# Patient Record
Sex: Male | Born: 1945 | Race: White | Hispanic: No | Smoking: Never smoker
Health system: Southern US, Community
[De-identification: ages and names within clinical notes are randomized; demographics above are authoritative.]

## PROBLEM LIST (undated history)

## (undated) DIAGNOSIS — I10 Essential (primary) hypertension: Secondary | ICD-10-CM

## (undated) DIAGNOSIS — N200 Calculus of kidney: Secondary | ICD-10-CM

## (undated) DIAGNOSIS — N189 Chronic kidney disease, unspecified: Secondary | ICD-10-CM

## (undated) HISTORY — PX: CHOLECYSTECTOMY: SHX55

## (undated) HISTORY — PX: HEMORRHOID SURGERY: SHX153

## (undated) HISTORY — PX: BACK SURGERY: SHX140

---

## 2000-07-10 ENCOUNTER — Encounter: Payer: Self-pay | Admitting: Neurosurgery

## 2000-07-10 ENCOUNTER — Ambulatory Visit (HOSPITAL_COMMUNITY): Admission: RE | Admit: 2000-07-10 | Discharge: 2000-07-10 | Payer: Self-pay | Admitting: Neurosurgery

## 2010-09-27 ENCOUNTER — Encounter: Admission: RE | Admit: 2010-09-27 | Discharge: 2010-09-27 | Payer: Self-pay | Admitting: Neurosurgery

## 2012-06-24 ENCOUNTER — Emergency Department (HOSPITAL_BASED_OUTPATIENT_CLINIC_OR_DEPARTMENT_OTHER): Payer: BC Managed Care – PPO

## 2012-06-24 ENCOUNTER — Emergency Department (HOSPITAL_BASED_OUTPATIENT_CLINIC_OR_DEPARTMENT_OTHER)
Admission: EM | Admit: 2012-06-24 | Discharge: 2012-06-25 | Disposition: A | Discharge status: Home / Self Care | Payer: BC Managed Care – PPO | Attending: Emergency Medicine | Admitting: Emergency Medicine

## 2012-06-24 ENCOUNTER — Encounter (HOSPITAL_BASED_OUTPATIENT_CLINIC_OR_DEPARTMENT_OTHER): Payer: Self-pay | Admitting: *Deleted

## 2012-06-24 DIAGNOSIS — N200 Calculus of kidney: Secondary | ICD-10-CM | POA: Insufficient documentation

## 2012-06-24 DIAGNOSIS — N289 Disorder of kidney and ureter, unspecified: Secondary | ICD-10-CM | POA: Insufficient documentation

## 2012-06-24 DIAGNOSIS — R109 Unspecified abdominal pain: Secondary | ICD-10-CM | POA: Insufficient documentation

## 2012-06-24 DIAGNOSIS — R1110 Vomiting, unspecified: Secondary | ICD-10-CM | POA: Insufficient documentation

## 2012-06-24 HISTORY — DX: Calculus of kidney: N20.0

## 2012-06-24 LAB — BASIC METABOLIC PANEL
BUN: 29 mg/dL — ABNORMAL HIGH (ref 6–23)
GFR calc Af Amer: 51 mL/min — ABNORMAL LOW (ref >90)
GFR calc non Af Amer: 44 mL/min — ABNORMAL LOW (ref >90)
Potassium: 4.7 mEq/L (ref 3.5–5.1)
Sodium: 141 mEq/L (ref 135–145)

## 2012-06-24 LAB — URINALYSIS, ROUTINE W REFLEX MICROSCOPIC
Nitrite: NEGATIVE
Specific Gravity, Urine: 1.031 — ABNORMAL HIGH (ref 1.005–1.030)
Urobilinogen, UA: 0.2 mg/dL (ref 0.0–1.0)

## 2012-06-24 LAB — URINE MICROSCOPIC-ADD ON

## 2012-06-24 MED ORDER — HYDROMORPHONE HCL PF 1 MG/ML IJ SOLN
1.0000 mg | Freq: Once | INTRAMUSCULAR | Status: AC
  Administered 2012-06-24: 1 mg via INTRAVENOUS
  Filled 2012-06-24: qty 1

## 2012-06-24 MED ORDER — KETOROLAC TROMETHAMINE 30 MG/ML IJ SOLN
INTRAMUSCULAR | Status: AC
  Filled 2012-06-24: qty 1

## 2012-06-24 MED ORDER — SODIUM CHLORIDE 0.9 % IV BOLUS (SEPSIS)
500.0000 mL | Freq: Once | INTRAVENOUS | Status: AC
  Administered 2012-06-24: 500 mL via INTRAVENOUS

## 2012-06-24 MED ORDER — HYDROMORPHONE HCL PF 1 MG/ML IJ SOLN: 1.0000 mg | Freq: Once | INTRAMUSCULAR | Status: DC

## 2012-06-24 MED ORDER — HYDROMORPHONE HCL PF 1 MG/ML IJ SOLN
INTRAMUSCULAR | Status: AC
  Filled 2012-06-24: qty 1

## 2012-06-24 MED ORDER — ONDANSETRON HCL 4 MG/2ML IJ SOLN
4.0000 mg | Freq: Once | INTRAMUSCULAR | Status: AC
  Administered 2012-06-24: 4 mg via INTRAVENOUS
  Filled 2012-06-24: qty 2

## 2012-06-24 NOTE — ED Notes (Signed)
Pt has hx of kidney stone in March and states this feels the same.

## 2012-06-24 NOTE — ED Provider Notes (Signed)
History     CSN: [id number]  Arrival date & time 06/24/12  1517   First MD Initiated Contact with Patient 06/24/12 1534      Chief Complaint  Patient presents with  . Flank Pain    (Consider location/radiation/quality/duration/timing/severity/associated sxs/prior treatment) HPI Comments: Pt states he had kidney stone and he is having similar symptoms  Patient is a 66 y.o. male presenting with flank pain. The history is provided by the patient. No language interpreter was used.  Flank Pain This is a recurrent problem. The current episode started today. The problem occurs constantly. The problem has been unchanged. Associated symptoms include abdominal pain and vomiting. Pertinent negatives include no fever. Nothing aggravates the symptoms. Treatments tried: percocet. The treatment provided moderate relief.    Past Medical History  Diagnosis Date  . Kidney stone     Past Surgical History  Procedure Date  . Cholecystectomy   . Back surgery   . Hemorrhoid surgery     History reviewed. No pertinent family history.  History  Substance Use Topics  . Smoking status: Never Smoker   . Smokeless tobacco: Not on file  . Alcohol Use: No      Review of Systems  Constitutional: Negative for fever.  Respiratory: Negative.   Cardiovascular: Negative.   Gastrointestinal: Positive for vomiting and abdominal pain.  Genitourinary: Positive for flank pain.    Allergies  Review of patient's allergies indicates no known allergies.  Home Medications   Current Outpatient Rx  Name Route Sig Dispense Refill  . VITAMIN D 1000 UNITS PO TABS Oral Take 2,000 Units by mouth daily.    [name] Kitchen NAPROXEN SODIUM 220 MG PO TABS Oral Take 440 mg by mouth 2 (two) times daily with a meal.    . OMEPRAZOLE 20 MG PO CPDR Oral Take 20 mg by mouth daily.    . OXYCODONE-ACETAMINOPHEN 5-325 MG PO TABS Oral Take 1-2 tablets by mouth every 4 (four) hours as needed. For pain    . TETRAHYDROZOLINE-ZN SULFATE  0.05-0.25 % OP SOLN Both Eyes Place 2 drops into both eyes 2 (two) times daily as needed. For dry, itchy eyes    . ZOLPIDEM TARTRATE 10 MG PO TABS Oral Take 10 mg by mouth at bedtime as needed. For sleep      BP 170/83  Pulse 58  Temp 97.4 F (36.3 C) (Oral)  Resp 20  Ht 6\' 1"  (1.854 m)  Wt 225 lb (102.059 kg)  BMI 29.69 kg/m2  SpO2 98%  Physical Exam  Nursing note and vitals reviewed. Constitutional: He is oriented to person, place, and time. He appears well-developed and well-nourished.  HENT:  Head: Normocephalic and atraumatic.  Eyes: Conjunctivae and EOM are normal.  Neck: Neck supple.  Cardiovascular: Normal rate and regular rhythm.   Pulmonary/Chest: Effort normal and breath sounds normal.  Abdominal: Soft. Bowel sounds are normal. There is no tenderness.  Musculoskeletal: Normal range of motion.  Neurological: He is alert and oriented to person, place, and time.  Skin: Skin is warm and dry.  Psychiatric: He has a normal mood and affect.    ED Course  Procedures (including critical care time)  Labs Reviewed  URINALYSIS, ROUTINE W REFLEX MICROSCOPIC - Abnormal; Notable for the following:    Color, Urine AMBER (*)  BIOCHEMICALS MAY BE AFFECTED BY COLOR   Specific Gravity, Urine 1.031 (*)     Hgb urine dipstick LARGE (*)     Bilirubin Urine SMALL (*)  Ketones, ur 15 (*)     All other components within normal limits  BASIC METABOLIC PANEL - Abnormal; Notable for the following:    Glucose, Bld 117 (*)     BUN 29 (*)     Creatinine, Ser 1.60 (*)     GFR calc non Af Amer 44 (*)     GFR calc Af Amer 51 (*)     All other components within normal limits  URINE MICROSCOPIC-ADD ON - Abnormal; Notable for the following:    Bacteria, UA FEW (*)     Casts HYALINE CASTS (*)     Crystals CA OXALATE CRYSTALS (*)     All other components within normal limits   No results found.   1. Renal insufficiency   2. Kidney stone       MDM  No acute findings noted on  x-ray:pt comfortable at discharge:pt sent home with scripts and follow up with his urologist on paper charting        Teressa Lower, NP 06/27/12 1248  Teressa Lower, NP 06/27/12 1541

## 2012-06-28 NOTE — ED Provider Notes (Signed)
Medical screening examination/treatment/procedure(s) were performed by non-physician practitioner and as supervising physician I was immediately available for consultation/collaboration.   Charles B. Bernette Mayers, MD 06/28/12 [phone]

## 2014-06-10 DIAGNOSIS — H9201 Otalgia, right ear: Secondary | ICD-10-CM | POA: Insufficient documentation

## 2015-12-09 DIAGNOSIS — R7989 Other specified abnormal findings of blood chemistry: Secondary | ICD-10-CM | POA: Insufficient documentation

## 2015-12-09 HISTORY — DX: Other specified abnormal findings of blood chemistry: R79.89

## 2016-03-03 DIAGNOSIS — M2011 Hallux valgus (acquired), right foot: Secondary | ICD-10-CM | POA: Insufficient documentation

## 2016-03-03 DIAGNOSIS — L84 Corns and callosities: Secondary | ICD-10-CM | POA: Insufficient documentation

## 2016-11-08 DIAGNOSIS — Z96651 Presence of right artificial knee joint: Secondary | ICD-10-CM | POA: Insufficient documentation

## 2016-12-28 DIAGNOSIS — N4341 Spermatocele of epididymis, single: Secondary | ICD-10-CM | POA: Insufficient documentation

## 2016-12-28 DIAGNOSIS — N5203 Combined arterial insufficiency and corporo-venous occlusive erectile dysfunction: Secondary | ICD-10-CM | POA: Insufficient documentation

## 2017-04-18 DIAGNOSIS — M51369 Other intervertebral disc degeneration, lumbar region without mention of lumbar back pain or lower extremity pain: Secondary | ICD-10-CM

## 2017-04-18 DIAGNOSIS — M5136 Other intervertebral disc degeneration, lumbar region: Secondary | ICD-10-CM

## 2017-04-18 DIAGNOSIS — Z981 Arthrodesis status: Secondary | ICD-10-CM | POA: Insufficient documentation

## 2017-04-18 HISTORY — DX: Other intervertebral disc degeneration, lumbar region without mention of lumbar back pain or lower extremity pain: M51.369

## 2018-01-06 DIAGNOSIS — N1831 Chronic kidney disease, stage 3a: Secondary | ICD-10-CM | POA: Diagnosis present

## 2018-01-06 DIAGNOSIS — N183 Chronic kidney disease, stage 3 unspecified: Secondary | ICD-10-CM

## 2018-01-06 HISTORY — DX: Chronic kidney disease, stage 3 unspecified: N18.30

## 2018-09-10 DIAGNOSIS — N200 Calculus of kidney: Secondary | ICD-10-CM | POA: Insufficient documentation

## 2018-10-02 DIAGNOSIS — R972 Elevated prostate specific antigen [PSA]: Secondary | ICD-10-CM | POA: Insufficient documentation

## 2019-09-25 DIAGNOSIS — Z9182 Personal history of military deployment: Secondary | ICD-10-CM | POA: Insufficient documentation

## 2019-09-25 DIAGNOSIS — E782 Mixed hyperlipidemia: Secondary | ICD-10-CM

## 2019-09-25 DIAGNOSIS — F4312 Post-traumatic stress disorder, chronic: Secondary | ICD-10-CM | POA: Insufficient documentation

## 2019-09-25 DIAGNOSIS — F5221 Male erectile disorder: Secondary | ICD-10-CM | POA: Insufficient documentation

## 2019-09-25 DIAGNOSIS — K219 Gastro-esophageal reflux disease without esophagitis: Secondary | ICD-10-CM

## 2019-09-25 DIAGNOSIS — G4700 Insomnia, unspecified: Secondary | ICD-10-CM

## 2019-09-25 HISTORY — DX: Insomnia, unspecified: G47.00

## 2019-09-25 HISTORY — DX: Gastro-esophageal reflux disease without esophagitis: K21.9

## 2019-09-25 HISTORY — DX: Mixed hyperlipidemia: E78.2

## 2019-12-27 ENCOUNTER — Ambulatory Visit: Payer: Medicare Other | Attending: Internal Medicine

## 2019-12-27 DIAGNOSIS — Z23 Encounter for immunization: Secondary | ICD-10-CM

## 2019-12-27 NOTE — Progress Notes (Signed)
   Covid-19 Vaccination Clinic  Name:  Brandon Burns    MRN: [id number] DOB: 1946/01/30  12/27/2019  [name] was observed post Covid-19 immunization for 15 minutes without incidence. He was provided with Vaccine Information Sheet and instruction to access the V-Safe system.   [name] was instructed to call 911 with any severe reactions post vaccine: [name] Kitchen Difficulty breathing  . Swelling of your face and throat  . A fast heartbeat  . A bad rash all over your body  . Dizziness and weakness    Immunizations Administered    Name Date Dose VIS Date Route   Pfizer COVID-19 Vaccine 12/27/2019  6:12 PM 0.3 mL 11/15/2019 Intramuscular   Manufacturer: [employer], [employer]   Lot: [id number]   NDC: [address]-8

## 2020-01-14 ENCOUNTER — Ambulatory Visit: Payer: Medicare Other | Attending: Internal Medicine

## 2020-01-14 DIAGNOSIS — Z23 Encounter for immunization: Secondary | ICD-10-CM

## 2020-01-14 NOTE — Progress Notes (Signed)
   Covid-19 Vaccination Clinic  Name:  NYQUAN SELBE    MRN: [id number] DOB: [birth year]  01/14/2020  Mr. Mcpartlin was observed post Covid-19 immunization for 15 minutes without incidence. He was provided with Vaccine Information Sheet and instruction to access the V-Safe system.   Mr. Lenis was instructed to call 911 with any severe reactions post vaccine: [name] Kitchen Difficulty breathing  . Swelling of your face and throat  . A fast heartbeat  . A bad rash all over your body  . Dizziness and weakness    Immunizations Administered    Name Date Dose VIS Date Route   Pfizer COVID-19 Vaccine 01/14/2020 12:47 PM 0.3 mL 11/15/2019 Intramuscular   Manufacturer: [employer], [employer]   Lot: [id number]   NDC: [address]-7

## 2020-06-14 ENCOUNTER — Encounter (HOSPITAL_BASED_OUTPATIENT_CLINIC_OR_DEPARTMENT_OTHER): Payer: Self-pay | Admitting: Emergency Medicine

## 2020-06-14 ENCOUNTER — Emergency Department (HOSPITAL_BASED_OUTPATIENT_CLINIC_OR_DEPARTMENT_OTHER)
Admission: EM | Admit: 2020-06-14 | Discharge: 2020-06-14 | Disposition: A | Discharge status: Home / Self Care | Payer: Medicare Other | Attending: Emergency Medicine | Admitting: Emergency Medicine

## 2020-06-14 ENCOUNTER — Other Ambulatory Visit: Payer: Self-pay

## 2020-06-14 DIAGNOSIS — N3000 Acute cystitis without hematuria: Secondary | ICD-10-CM

## 2020-06-14 DIAGNOSIS — R300 Dysuria: Secondary | ICD-10-CM | POA: Diagnosis present

## 2020-06-14 LAB — URINALYSIS, ROUTINE W REFLEX MICROSCOPIC
Bilirubin Urine: NEGATIVE
Glucose, UA: NEGATIVE mg/dL
Ketones, ur: NEGATIVE mg/dL
Nitrite: NEGATIVE
Protein, ur: NEGATIVE mg/dL
Specific Gravity, Urine: 1.015 (ref 1.005–1.030)
pH: 5.5 (ref 5.0–8.0)

## 2020-06-14 LAB — URINALYSIS, MICROSCOPIC (REFLEX): Squamous Epithelial / LPF: NONE SEEN (ref 0–5)

## 2020-06-14 MED ORDER — CEPHALEXIN 500 MG PO CAPS: 500.0000 mg | ORAL_CAPSULE | Freq: Three times a day (TID) | ORAL | 0 refills | Status: DC

## 2020-06-14 MED ORDER — CEPHALEXIN 250 MG PO CAPS
500.0000 mg | ORAL_CAPSULE | Freq: Once | ORAL | Status: AC
  Administered 2020-06-14: 500 mg via ORAL
  Filled 2020-06-14: qty 2

## 2020-06-14 NOTE — ED Triage Notes (Signed)
Pt reports frequent urination and dysuria, flank pain onset today. Denies fever.

## 2020-06-14 NOTE — ED Provider Notes (Signed)
[facility] EMERGENCY DEPARTMENT Provider Note   CSN: [id number] Arrival date & time: 06/14/20  1930     History Chief Complaint  Patient presents with  . Dysuria    Brandon Burns is a 74 y.o. male.  HPI     This is a 74 year old male with no significant past medical history who presents with dysuria and frequency.  Patient reports 1 day history of urgency and frequency.  History of UTI in the past with similar symptoms.  Denies any systemic symptoms including fever, abdominal pain, nausea, vomiting, back pain.  Reports that he feels somewhat better but has persistent frequency.  No history of kidney stones.  Past Medical History:  Diagnosis Date  . Kidney stone     There are no problems to display for this patient.   Past Surgical History:  Procedure Laterality Date  . BACK SURGERY    . CHOLECYSTECTOMY    . HEMORRHOID SURGERY         No family history on file.  Social History   Tobacco Use  . Smoking status: Never Smoker  . Smokeless tobacco: Never Used  Substance Use Topics  . Alcohol use: No  . Drug use: No    Home Medications Prior to Admission medications   Medication Sig Start Date End Date Taking? Authorizing Provider  cephALEXin (KEFLEX) 500 MG capsule Take 1 capsule (500 mg total) by mouth 3 (three) times daily. 06/14/20   Nihaal Friesen, Mayer Masker, MD  cholecalciferol (VITAMIN D) 1000 UNITS tablet Take 2,000 Units by mouth daily.    [provider]  naproxen sodium (ANAPROX) 220 MG tablet Take 440 mg by mouth 2 (two) times daily with a meal.    [provider]  omeprazole (PRILOSEC) 20 MG capsule Take 20 mg by mouth daily.    [provider]  oxyCODONE-acetaminophen (PERCOCET/ROXICET) 5-325 MG per tablet Take 1-2 tablets by mouth every 4 (four) hours as needed. For pain    [provider]  tetrahydrozoline-zinc (VISINE-AC) 0.05-0.25 % ophthalmic solution Place 2 drops into both eyes 2 (two) times daily as  needed. For dry, itchy eyes    [provider]  zolpidem (AMBIEN) 10 MG tablet Take 10 mg by mouth at bedtime as needed. For sleep    [provider]    Allergies    Patient has no known allergies.  Review of Systems   Review of Systems  Constitutional: Negative for fever.  Respiratory: Negative for shortness of breath.   Cardiovascular: Negative for chest pain.  Gastrointestinal: Negative for abdominal pain, nausea and vomiting.  Genitourinary: Positive for dysuria and frequency. Negative for flank pain.  All other systems reviewed and are negative.   Physical Exam Updated Vital Signs BP 128/85 (BP Location: Right Arm)   Pulse 66   Temp 98.7 F (37.1 C) (Oral)   Resp 16   Ht 1.854 m (6\' 1" )   SpO2 99%   BMI 29.69 kg/m   Physical Exam Vitals and nursing note reviewed.  Constitutional:      Appearance: He is well-developed. He is not ill-appearing.  HENT:     Head: Normocephalic and atraumatic.     Mouth/Throat:     Mouth: Mucous membranes are moist.  Eyes:     Pupils: Pupils are equal, round, and reactive to light.  Cardiovascular:     Rate and Rhythm: Normal rate and regular rhythm.     Heart sounds: Normal heart sounds. No murmur heard.  Pulmonary:     Effort: Pulmonary effort is normal. No respiratory distress.     Breath sounds: Normal breath sounds. No wheezing.  Abdominal:     General: Bowel sounds are normal.     Palpations: Abdomen is soft.     Tenderness: There is no abdominal tenderness. There is no right CVA tenderness, left CVA tenderness or rebound.  Musculoskeletal:     Cervical back: Neck supple.  Lymphadenopathy:     Cervical: No cervical adenopathy.  Skin:    General: Skin is warm and dry.  Neurological:     Mental Status: He is alert and oriented to person, place, and time.  Psychiatric:        Mood and Affect: Mood normal.     ED Results / Procedures / Treatments   Labs (all labs ordered are listed, but only  abnormal results are displayed) Labs Reviewed  URINALYSIS, ROUTINE W REFLEX MICROSCOPIC - Abnormal; Notable for the following components:      Result Value   APPearance CLOUDY (*)    Hgb urine dipstick SMALL (*)    Leukocytes,Ua MODERATE (*)    All other components within normal limits  URINALYSIS, MICROSCOPIC (REFLEX) - Abnormal; Notable for the following components:   Bacteria, UA FEW (*)    All other components within normal limits    EKG None  Radiology No results found.  Procedures Procedures (including critical care time)  Medications Ordered in ED Medications  cephALEXin (KEFLEX) capsule 500 mg (has no administration in time range)    ED Course  I have reviewed the triage vital signs and the nursing notes.  Pertinent labs & imaging results that were available during my care of the patient were reviewed by me and considered in my medical decision making (see chart for details).    MDM Rules/Calculators/A&P                           Patient presents with urgency, frequency, dysuria.  He is nontoxic-appearing and vital signs are reassuring.  He is afebrile.  No CVA tenderness.  Suspect acute cystitis.  Lower suspicion for pyelonephritis or kidney stones.  Urinalysis reviewed with moderate leukocyte esterase, 21-50 white cells and few bacteria.  We will send culture and treat given symptoms.  Do not feel he needs further evaluation as he is well-appearing and without any other symptoms.  Patient was given strict return precautions.  After history, exam, and medical workup I feel the patient has been appropriately medically screened and is safe for discharge home. Pertinent diagnoses were discussed with the patient. Patient was given return precautions.   Final Clinical Impression(s) / ED Diagnoses Final diagnoses:  Acute cystitis without hematuria    Rx / DC Orders ED Discharge Orders         Ordered    cephALEXin (KEFLEX) 500 MG capsule  3 times daily      Discontinue  Reprint     06/14/20 2320           Shon Baton, MD 06/14/20 2323

## 2020-06-14 NOTE — ED Notes (Signed)
ED Provider at bedside, Dr Horton 

## 2020-06-14 NOTE — Discharge Instructions (Addendum)
You are seen today and found to have a urinary tract infection.  Take antibiotics as prescribed.  If you develop fevers, back pain, nausea, vomiting, you should be reevaluated immediately.

## 2020-06-17 LAB — URINE CULTURE: Culture: >=100000 — AB

## 2020-06-18 ENCOUNTER — Telehealth: Payer: Self-pay

## 2020-06-18 NOTE — Progress Notes (Signed)
ED Antimicrobial Stewardship Positive Culture Follow Up   Brandon Burns is an 74 y.o. male who presented to [facility] on 06/14/2020 with a chief complaint of dysuria + frequency Chief Complaint  Patient presents with  . Dysuria    Recent Results (from the past 720 hour(s))  Urine culture     Status: Abnormal   Collection Time: 06/14/20  7:51 PM   Specimen: Urine, Random  Result Value Ref Range Status   Specimen Description   Final    URINE, RANDOM Performed at [facility], [address] [place], [place] [address]    Special Requests   Final    NONE Performed at [facility], [address] [place], [place] [address]    Culture >=100,000 COLONIES/mL ENTEROCOCCUS FAECALIS (A)  Final   Report Status 06/17/2020 FINAL  Final   Organism ID, Bacteria ENTEROCOCCUS FAECALIS (A)  Final      Susceptibility   Enterococcus faecalis - MIC*    AMPICILLIN <=2 SENSITIVE Sensitive     NITROFURANTOIN <=16 SENSITIVE Sensitive     VANCOMYCIN 1 SENSITIVE Sensitive     * >=100,000 COLONIES/mL ENTEROCOCCUS FAECALIS    [x]  Treated with Keflex, organism resistant to prescribed antimicrobial  New antibiotic prescription: Amoxil 500mg  PO BID  X 5d  ED Provider: , PA-C   , PharmD Clinical Pharmacist ED Pharmacist Phone # [phone] 06/18/2020 12:40 PM

## 2020-06-18 NOTE — Telephone Encounter (Signed)
Post ED Visit - Positive Culture Follow-up: Unsuccessful Patient Follow-up  Culture assessed and recommendations reviewed by:  []  , Pharm.D. []  Enzo Bi, Pharm.D., BCPS AQ-ID []  , Pharm.D., BCPS []  Celedonio Miyamoto, Pharm.D., BCPS []  McCormick, Garvin Fila.D., BCPS, AAHIVP []  , Pharm.D., BCPS, AAHIVP []  Georgina Pillion, PharmD []  , PharmD, BCPS  Jonanthan Oriet Pharm D Positive urine culture  []  Patient discharged without antimicrobial prescription and treatment is now indicated [x]  Organism is resistant to prescribed ED discharge antimicrobial []  Patient with positive blood cultures  Needs change to Amoxil 500 mg Q8 h x 5 days Per [facility] Unable to contact patient after 3 attempts, letter will be sent to address on file  06/18/2020, 1:38 PM

## 2020-06-27 ENCOUNTER — Telehealth (HOSPITAL_BASED_OUTPATIENT_CLINIC_OR_DEPARTMENT_OTHER): Payer: Self-pay

## 2020-06-27 NOTE — Telephone Encounter (Signed)
Late entry 06/26/20@ 2036 Pt calling after receiving letter.  ID verified.  Pt informed need to change abx based on urine culture.  Rx for "Amoxil 500 mg po q8 hrs x 5 days" ordered by Fayrene Helper PA called in to [facility] in [place] [phone] 06/27/20@ 0656 and left on VM.

## 2020-07-21 ENCOUNTER — Other Ambulatory Visit: Payer: Self-pay

## 2020-07-21 ENCOUNTER — Encounter (HOSPITAL_BASED_OUTPATIENT_CLINIC_OR_DEPARTMENT_OTHER): Payer: Self-pay | Admitting: *Deleted

## 2020-07-21 ENCOUNTER — Emergency Department (HOSPITAL_BASED_OUTPATIENT_CLINIC_OR_DEPARTMENT_OTHER)
Admission: EM | Admit: 2020-07-21 | Discharge: 2020-07-21 | Disposition: A | Discharge status: Home / Self Care | Payer: Medicare Other | Attending: Emergency Medicine | Admitting: Emergency Medicine

## 2020-07-21 DIAGNOSIS — K0889 Other specified disorders of teeth and supporting structures: Secondary | ICD-10-CM | POA: Diagnosis present

## 2020-07-21 DIAGNOSIS — K029 Dental caries, unspecified: Secondary | ICD-10-CM | POA: Insufficient documentation

## 2020-07-21 MED ORDER — PENICILLIN V POTASSIUM 250 MG PO TABS
500.0000 mg | ORAL_TABLET | Freq: Once | ORAL | Status: AC
  Administered 2020-07-21: 500 mg via ORAL
  Filled 2020-07-21: qty 2

## 2020-07-21 MED ORDER — PENICILLIN V POTASSIUM 500 MG PO TABS: 500.0000 mg | ORAL_TABLET | Freq: Four times a day (QID) | ORAL | 0 refills | Status: AC

## 2020-07-21 MED ORDER — CHLORHEXIDINE GLUCONATE 0.12 % MT SOLN: 15.0000 mL | Freq: Two times a day (BID) | OROMUCOSAL | 0 refills | Status: DC

## 2020-07-21 MED FILL — CHLORHEXIDINE 0.12% RINSE: 0.12 | 16 days supply | Qty: 473 | Fill #0

## 2020-07-21 MED FILL — PENICILLIN VK 500 MG TABLET: 500 | 7 days supply | Qty: 28 | Fill #0

## 2020-07-21 NOTE — ED Provider Notes (Signed)
[facility] EMERGENCY DEPARTMENT Provider Note   CSN: [id number] Arrival date & time: 07/21/20  1621     History Chief Complaint  Patient presents with   Dental Pain    Brandon Burns is a 74 y.o. male history of kidney stones, cholecystectomy.  Patient presents today for left upper dental pain.  He reports he chipped one of his left upper molars around 1 year ago but never follow-up with his dentist, he reports intermittent pain of the area primarily with chewing on the left side.  Over the last 3 days he has had increased pain throbbing sensation constant worsened with chewing left side no alleviating factors or radiation of pain.  [facility]  Denies fever/chills, headache, vision changes, pain with eye movement, facial swelling, difficulty swallowing, voice change, trismus, nausea/vomiting, abdominal pain or any additional concerns.  HPI     Past Medical History:  Diagnosis Date   Kidney stone     There are no problems to display for this patient.   Past Surgical History:  Procedure Laterality Date   BACK SURGERY     CHOLECYSTECTOMY     HEMORRHOID SURGERY         History reviewed. No pertinent family history.  Social History   Tobacco Use   Smoking status: Never Smoker   Smokeless tobacco: Never Used  Substance Use Topics   Alcohol use: No   Drug use: No    Home Medications Prior to Admission medications   Medication Sig Start Date End Date Taking? Authorizing Provider  cephALEXin (KEFLEX) 500 MG capsule Take 1 capsule (500 mg total) by mouth 3 (three) times daily. 06/14/20   Horton, Mayer Masker, MD  chlorhexidine (PERIDEX) 0.12 % solution Use as directed 15 mLs in the mouth or throat 2 (two) times daily. Rinse and Spit. Do not swallow. 07/21/20   Harlene Salts A, PA-C  cholecalciferol (VITAMIN D) 1000 UNITS tablet Take 2,000 Units by mouth daily.    [provider]  naproxen  sodium (ANAPROX) 220 MG tablet Take 440 mg by mouth 2 (two) times daily with a meal.    [provider]  omeprazole (PRILOSEC) 20 MG capsule Take 20 mg by mouth daily.    [provider]  oxyCODONE-acetaminophen (PERCOCET/ROXICET) 5-325 MG per tablet Take 1-2 tablets by mouth every 4 (four) hours as needed. For pain    [provider]  penicillin v potassium (VEETID) 500 MG tablet Take 1 tablet (500 mg total) by mouth 4 (four) times daily for 7 days. 07/21/20 07/28/20  Bill Salinas, PA-C  tetrahydrozoline-zinc (VISINE-AC) 0.05-0.25 % ophthalmic solution Place 2 drops into both eyes 2 (two) times daily as needed. For dry, itchy eyes    [provider]  zolpidem (AMBIEN) 10 MG tablet Take 10 mg by mouth at bedtime as needed. For sleep    [provider]    Allergies    Patient has no known allergies.  Review of Systems   Review of Systems  Constitutional: Negative for chills and fever.  HENT: Positive for dental problem. Negative for drooling, sore throat, trouble swallowing and voice change.   Gastrointestinal: Negative.  Negative for abdominal pain, nausea and vomiting.  Musculoskeletal: Negative.  Negative for neck pain.  Neurological: Negative.  Negative for headaches.    Physical Exam Updated Vital Signs BP (!) 159/81    Pulse 64    Temp 97.9 F (36.6  C) (Oral)    Resp 18    Ht 6\' 1"  (1.854 m)    Wt 101.6 kg    SpO2 100%    BMI 29.55 kg/m   Physical Exam Constitutional:      General: He is not in acute distress.    Appearance: Normal appearance. He is well-developed. He is not ill-appearing or diaphoretic.  HENT:     Head: Normocephalic and atraumatic.     Jaw: There is normal jaw occlusion. No trismus.     Right Ear: Tympanic membrane and external ear normal.     Left Ear: Tympanic membrane and external ear normal.     Nose: Nose normal.     Right Sinus: No maxillary sinus tenderness.     Left Sinus: No maxillary sinus  tenderness.     Mouth/Throat:      Comments: Poor dentition overall multiple missing teeth and superficial dental caries.  Pain to percussion of left upper first molar.    The patient has normal phonation and is in control of secretions. No stridor.  Midline uvula without edema. Soft palate rises symmetrically. No tonsillar erythema, swelling or exudates. Tongue protrusion is normal, floor of mouth is soft. No trismus. No creptius on neck palpation. No gingival erythema or fluctuance noted. Mucus membranes moist. Eyes:     General: Vision grossly intact. Gaze aligned appropriately.     Extraocular Movements: Extraocular movements intact.     Pupils: Pupils are equal, round, and reactive to light.     Comments: No pain with extraocular motion, no entrapment.  Neck:     Trachea: Trachea and phonation normal. No tracheal tenderness or tracheal deviation.  Pulmonary:     Effort: Pulmonary effort is normal. No respiratory distress.  Abdominal:     General: There is no distension.     Palpations: Abdomen is soft.     Tenderness: There is no abdominal tenderness. There is no guarding or rebound.  Musculoskeletal:        General: Normal range of motion.     Cervical back: Normal range of motion and neck supple.  Skin:    General: Skin is warm and dry.  Neurological:     Mental Status: He is alert.     GCS: GCS eye subscore is 4. GCS verbal subscore is 5. GCS motor subscore is 6.     Comments: Speech is clear and goal oriented, follows commands Major Cranial nerves without deficit, no facial droop Moves extremities without ataxia, coordination intact  Psychiatric:        Behavior: Behavior normal.     ED Results / Procedures / Treatments   Labs (all labs ordered are listed, but only abnormal results are displayed) Labs Reviewed - No data to display  EKG None  Radiology No results found.  Procedures Procedures (including critical care time)  Medications Ordered in  ED Medications  penicillin v potassium (VEETID) tablet 500 mg (has no administration in time range)    ED Course  I have reviewed the triage vital signs and the nursing notes.  Pertinent labs & imaging results that were available during my care of the patient were reviewed by me and considered in my medical decision making (see chart for details).    MDM Rules/Calculators/A&P                          Additional history obtained from: 1. Nursing notes from this visit. ------------------- 74 year old  male presents with left upper dental pain after chipping his tooth 1 year ago pain increased over the last 3 days.  Tender to percussion of the first left upper molar, poor dentition overall multiple dental caries.  No signs or symptoms of dental abscess, no swelling/erythema/tenderness of the gums.  Patient is well-appearing, afebrile, nontoxic, speaking well.  Patient able to swallow without pain.  No signs of swelling or concern for Ludwig's angina/Peritonsilar abscess/Retropharyngeal abscess or other deep tissue infections.  No sign of swelling of the neck, patient has good range of motion of the neck, no trismus.  We will treat with penicillin VK 500 mg 4 times daily x7 days and encourage dental follow-up for definitive dental care.  Additionally will prescribe Peridex mouthwash and encouraged OTC anti-inflammatories.  At this time there does not appear to be any evidence of an acute emergency medical condition and the patient appears stable for discharge with appropriate outpatient follow up. Diagnosis was discussed with patient who verbalizes understanding of care plan and is agreeable to discharge. I have discussed return precautions with patient and wife who verbalizes understanding. Patient encouraged to follow-up with their PCP and dentist. All questions answered.  Patient's case discussed with Dr. Rush Landmark who agrees with plan to discharge with follow-up.   Note: Portions of this report  may have been transcribed using voice recognition software. Every effort was made to ensure accuracy; however, inadvertent computerized transcription errors may still be present.  Final Clinical Impression(s) / ED Diagnoses Final diagnoses:  Pain due to dental caries    Rx / DC Orders ED Discharge Orders         Ordered    penicillin v potassium (VEETID) 500 MG tablet  4 times daily     Discontinue  Reprint     07/21/20 1749    chlorhexidine (PERIDEX) 0.12 % solution  2 times daily     Discontinue  Reprint     07/21/20 1749           Bill Salinas, PA-C 07/21/20 1749    Tegeler, Canary Brim, MD 07/21/20 2325

## 2020-07-21 NOTE — Discharge Instructions (Addendum)
At this time there does not appear to be the presence of an emergent medical condition, however there is always the potential for conditions to change. Please read and follow the below instructions.  Please return to the Emergency Department immediately for any new or worsening symptoms. Please be sure to follow up with your Primary Care Provider within one week regarding your visit today; please call their office to schedule an appointment even if you are feeling better for a follow-up visit. Please take your antibiotic penicillin VK as prescribed until complete to help with your symptoms.  Please drink enough water to avoid dehydration and get plenty of rest. You may also use the Peridex mouth rinse to help with your symptoms.  Rinse and spit Peridex out, do not swallow it. Please call your dentist to schedule a follow-up appointment for definitive dental care.  If you are unable to get in contact with your dentist you may call Dr. Garvin Fila on your discharge paperwork to schedule a follow-up appointment.  Get help right away if: You cannot open your mouth. You are having trouble breathing or swallowing. You have a fever. Your face, neck, or jaw is swollen. You have any new/concerning or worsening of symptoms  Please read the additional information packets attached to your discharge summary.  Do not take your medicine if  develop an itchy rash, swelling in your mouth or lips, or difficulty breathing; call 911 and seek immediate emergency medical attention if this occurs.  You may review your lab tests and imaging results in their entirety on your MyChart account.  Please discuss all results of fully with your primary care provider and other specialist at your follow-up visit.  Note: Portions of this text may have been transcribed using voice recognition software. Every effort was made to ensure accuracy; however, inadvertent computerized transcription errors may still be present.

## 2020-07-21 NOTE — ED Triage Notes (Signed)
C/o toothache x 3 days

## 2020-09-18 ENCOUNTER — Other Ambulatory Visit (HOSPITAL_BASED_OUTPATIENT_CLINIC_OR_DEPARTMENT_OTHER): Payer: Self-pay | Admitting: Internal Medicine

## 2020-09-18 ENCOUNTER — Ambulatory Visit: Payer: Medicare Other | Attending: Internal Medicine

## 2020-09-18 DIAGNOSIS — Z23 Encounter for immunization: Secondary | ICD-10-CM

## 2020-09-18 NOTE — Progress Notes (Signed)
   Covid-19 Vaccination Clinic  Name:  Brandon Burns    MRN: [id number] DOB: 1946/07/05  09/18/2020  [name] was observed post Covid-19 immunization for 15 minutes without incident. He was provided with Vaccine Information Sheet and instruction to access the V-Safe system. Vaccinated by [facility].  [name] was instructed to call 911 with any severe reactions post vaccine: [name] Kitchen Difficulty breathing  . Swelling of face and throat  . A fast heartbeat  . A bad rash all over body  . Dizziness and weakness

## 2020-09-28 MED FILL — PFIZER-BIONTECH COVID-19 VA: 30 | 1 days supply | Qty: 0 | Fill #0

## 2020-12-05 DIAGNOSIS — E559 Vitamin D deficiency, unspecified: Secondary | ICD-10-CM

## 2020-12-05 HISTORY — DX: Vitamin D deficiency, unspecified: E55.9

## 2021-02-17 DIAGNOSIS — E538 Deficiency of other specified B group vitamins: Secondary | ICD-10-CM

## 2021-02-17 HISTORY — DX: Deficiency of other specified B group vitamins: E53.8

## 2021-03-27 ENCOUNTER — Emergency Department (HOSPITAL_BASED_OUTPATIENT_CLINIC_OR_DEPARTMENT_OTHER)
Admission: EM | Admit: 2021-03-27 | Discharge: 2021-03-27 | Disposition: A | Discharge status: Home / Self Care | Payer: Medicare Other | Attending: Emergency Medicine | Admitting: Emergency Medicine

## 2021-03-27 ENCOUNTER — Other Ambulatory Visit: Payer: Self-pay

## 2021-03-27 ENCOUNTER — Encounter (HOSPITAL_BASED_OUTPATIENT_CLINIC_OR_DEPARTMENT_OTHER): Payer: Self-pay | Admitting: *Deleted

## 2021-03-27 DIAGNOSIS — H9201 Otalgia, right ear: Secondary | ICD-10-CM

## 2021-03-27 DIAGNOSIS — H5789 Other specified disorders of eye and adnexa: Secondary | ICD-10-CM | POA: Insufficient documentation

## 2021-03-27 DIAGNOSIS — R0981 Nasal congestion: Secondary | ICD-10-CM | POA: Insufficient documentation

## 2021-03-27 MED ORDER — DEXAMETHASONE SODIUM PHOSPHATE 10 MG/ML IJ SOLN
8.0000 mg | Freq: Once | INTRAMUSCULAR | Status: AC
  Administered 2021-03-27: 8 mg via INTRAMUSCULAR
  Filled 2021-03-27: qty 1

## 2021-03-27 NOTE — Discharge Instructions (Addendum)
Please continue to take your amoxicillin as well as your Zyrtec.  I would also recommend you start taking Flonase twice a day.  This can be purchased over-the-counter.  Please continue to monitor your symptoms.  If they worsen, you can always return to the emergency department.  Below is a referral to a local ear, nose, and throat doctor.  Please give them a call and schedule a follow-up appointment.  It was a pleasure to meet you.

## 2021-03-27 NOTE — ED Provider Notes (Signed)
[facility] EMERGENCY DEPARTMENT Provider Note   CSN: [id number] Arrival date & time: 03/27/21  1819     History Chief Complaint  Patient presents with  . Otalgia    Brandon Burns is a 75 y.o. male.  HPI Patient is a 75 year old male with a medical history as noted below who presents the emergency department with right ear pain.  Patient states that he has a history of seasonal allergies as well as "sinus issues".  He states that his symptoms started about 3 days ago.  Reports associated eye redness as well as congestion.  He was evaluated at a local urgent care and started on amoxicillin and has been taking that without significant relief.  States he also started taking OTC eardrops as well as Zyrtec.  Denies any fevers, chills, or cough.  No URI symptoms.     Past Medical History:  Diagnosis Date  . Kidney stone     There are no problems to display for this patient.   Past Surgical History:  Procedure Laterality Date  . BACK SURGERY    . CHOLECYSTECTOMY    . HEMORRHOID SURGERY         No family history on file.  Social History   Tobacco Use  . Smoking status: Never Smoker  . Smokeless tobacco: Never Used  Substance Use Topics  . Alcohol use: No  . Drug use: No    Home Medications Prior to Admission medications   Medication Sig Start Date End Date Taking? Authorizing Provider  cephALEXin (KEFLEX) 500 MG capsule Take 1 capsule (500 mg total) by mouth 3 (three) times daily. 06/14/20   Horton, Mayer Masker, MD  chlorhexidine (PERIDEX) 0.12 % solution Use as directed 15 mLs in the mouth or throat 2 (two) times daily. Rinse and Spit. Do not swallow. 07/21/20   Harlene Salts A, PA-C  cholecalciferol (VITAMIN D) 1000 UNITS tablet Take 2,000 Units by mouth daily.    [provider]  COVID-19 mRNA vaccine, Pfizer, 30 MCG/0.3ML injection INJECT AS DIRECTED 09/18/20 09/18/21  Judyann Munson, MD  naproxen sodium (ANAPROX) 220 MG tablet Take 440 mg by  mouth 2 (two) times daily with a meal.    [provider]  omeprazole (PRILOSEC) 20 MG capsule Take 20 mg by mouth daily.    [provider]  oxyCODONE-acetaminophen (PERCOCET/ROXICET) 5-325 MG per tablet Take 1-2 tablets by mouth every 4 (four) hours as needed. For pain    [provider]  tetrahydrozoline-zinc (VISINE-AC) 0.05-0.25 % ophthalmic solution Place 2 drops into both eyes 2 (two) times daily as needed. For dry, itchy eyes    [provider]  zolpidem (AMBIEN) 10 MG tablet Take 10 mg by mouth at bedtime as needed. For sleep    [provider]    Allergies    Patient has no known allergies.  Review of Systems   Review of Systems  Constitutional: Negative for chills and fever.  HENT: Positive for congestion and ear pain. Negative for ear discharge, rhinorrhea, sore throat and trouble swallowing.   Eyes: Positive for redness.  Respiratory: Negative for cough and shortness of breath.   Cardiovascular: Negative for chest pain.   Physical Exam Updated Vital Signs BP (!) 154/79 (BP Location: Right Arm)   Pulse 67   Temp 98.2 F (36.8 C) (Oral)   Resp 16   Ht 6\' 1"  (1.854 m)   Wt 104.3 kg   SpO2 98%   BMI 30.34 kg/m  Physical Exam Vitals and nursing note reviewed.  Constitutional:      General: He is not in acute distress.    Appearance: He is well-developed.  HENT:     Head: Normocephalic and atraumatic.     Comments: No tenderness appreciated over the bilateral frontal and maxillary sinuses.    Right Ear: Ear canal and external ear normal. There is no impacted cerumen.     Left Ear: Tympanic membrane, ear canal and external ear normal. There is no impacted cerumen.     Ears:     Comments: Right ear and EAC appear normal.  Right TM is mildly erythematous but nonbulging.  Does not appear to have middle ear effusion.  Left ear, EAC, and TM all appear normal.    Nose: Nose normal. No congestion or rhinorrhea.     Comments:  Mild erythema noted along the turbinates in the bilateral nares.    Mouth/Throat:     Mouth: Mucous membranes are moist.     Pharynx: Oropharynx is clear. No oropharyngeal exudate or posterior oropharyngeal erythema.     Comments: Uvula midline.  Readily handling secretions.  No erythema noted in the posterior oropharynx. Eyes:     General: No scleral icterus.       Right eye: No discharge.        Left eye: No discharge.     Conjunctiva/sclera: Conjunctivae normal.     Comments: Bilateral sclera are injected.  Neck:     Trachea: No tracheal deviation.  Cardiovascular:     Rate and Rhythm: Normal rate.  Pulmonary:     Effort: Pulmonary effort is normal. No respiratory distress.     Breath sounds: No stridor.  Abdominal:     General: There is no distension.  Musculoskeletal:        General: No swelling or deformity.     Cervical back: Neck supple.  Skin:    General: Skin is warm and dry.     Findings: No rash.  Neurological:     Mental Status: He is alert.     Cranial Nerves: Cranial nerve deficit: no gross deficits.     ED Results / Procedures / Treatments   Labs (all labs ordered are listed, but only abnormal results are displayed) Labs Reviewed - No data to display  EKG None  Radiology No results found.  Procedures Procedures   Medications Ordered in ED Medications  dexamethasone (DECADRON) injection 8 mg (8 mg Intramuscular Given 03/27/21 1845)    ED Course  I have reviewed the triage vital signs and the nursing notes.  Pertinent labs & imaging results that were available during my care of the patient were reviewed by me and considered in my medical decision making (see chart for details).    MDM Rules/Calculators/A&P                          Patient is a 75 year old male who presents the emergency department due to congestion as well as right ear pain.  Has was initially evaluated at a local urgent care and is on day 3 of 7 of a course of amoxicillin.   He is also taking Zyrtec.  Unsure of the source of his symptoms.  He does have a small amount of erythema on the right TM as well as injected sclera and erythematous nasal turbinates.  Does not appear to have a bulging TM on the right or a middle ear effusion.  Symptoms could possibly be secondary to seasonal allergies.  He does note that he has a history of right ear issues from a injury in [place] and had a tympanostomy tube placed in the right ear years ago.  Urged patient to continue taking his amoxicillin as well as Zyrtec.  Also recommended that he start taking Flonase as well.  He was given a dose of Decadron in the emergency department which he requested.  No history of diabetes mellitus.  Patient given a referral to a local ear, nose, and throat office.  Recommended that he follow-up with them if his symptoms have not improved in the next few days.  Feel that he is stable for discharge at this time and he is agreeable.  His questions were answered and he was amicable at the time of discharge.   Final Clinical Impression(s) / ED Diagnoses Final diagnoses:  Right ear pain   Rx / DC Orders ED Discharge Orders    None       Placido Sou, PA-C 03/27/21 1851    Charlynne Pander, MD 03/28/21 [phone]

## 2021-03-27 NOTE — ED Triage Notes (Addendum)
Pt reports right ear pain, congestion, "sinus issues" x 3 days. Reports decreased hearing right ear. States has tried OTC meds without relief. States had a negative covid test this week

## 2021-04-07 DIAGNOSIS — H6991 Unspecified Eustachian tube disorder, right ear: Secondary | ICD-10-CM | POA: Insufficient documentation

## 2021-05-05 DIAGNOSIS — H90A31 Mixed conductive and sensorineural hearing loss, unilateral, right ear with restricted hearing on the contralateral side: Secondary | ICD-10-CM | POA: Insufficient documentation

## 2021-07-29 DIAGNOSIS — M1712 Unilateral primary osteoarthritis, left knee: Secondary | ICD-10-CM | POA: Insufficient documentation

## 2021-08-30 ENCOUNTER — Other Ambulatory Visit: Payer: Self-pay

## 2021-08-30 ENCOUNTER — Other Ambulatory Visit (HOSPITAL_BASED_OUTPATIENT_CLINIC_OR_DEPARTMENT_OTHER): Payer: Self-pay

## 2021-08-30 ENCOUNTER — Encounter (HOSPITAL_BASED_OUTPATIENT_CLINIC_OR_DEPARTMENT_OTHER): Payer: Self-pay | Admitting: Emergency Medicine

## 2021-08-30 ENCOUNTER — Emergency Department (HOSPITAL_BASED_OUTPATIENT_CLINIC_OR_DEPARTMENT_OTHER)
Admission: EM | Admit: 2021-08-30 | Discharge: 2021-08-30 | Disposition: A | Discharge status: Home / Self Care | Payer: Medicare Other | Attending: Emergency Medicine | Admitting: Emergency Medicine

## 2021-08-30 ENCOUNTER — Emergency Department (HOSPITAL_BASED_OUTPATIENT_CLINIC_OR_DEPARTMENT_OTHER): Payer: Medicare Other

## 2021-08-30 DIAGNOSIS — R300 Dysuria: Secondary | ICD-10-CM | POA: Insufficient documentation

## 2021-08-30 DIAGNOSIS — I10 Essential (primary) hypertension: Secondary | ICD-10-CM | POA: Insufficient documentation

## 2021-08-30 DIAGNOSIS — R109 Unspecified abdominal pain: Secondary | ICD-10-CM | POA: Diagnosis not present

## 2021-08-30 DIAGNOSIS — R350 Frequency of micturition: Secondary | ICD-10-CM | POA: Insufficient documentation

## 2021-08-30 DIAGNOSIS — M545 Low back pain, unspecified: Secondary | ICD-10-CM | POA: Insufficient documentation

## 2021-08-30 HISTORY — DX: Essential (primary) hypertension: I10

## 2021-08-30 LAB — URINALYSIS, ROUTINE W REFLEX MICROSCOPIC
Bilirubin Urine: NEGATIVE
Glucose, UA: NEGATIVE mg/dL
Hgb urine dipstick: NEGATIVE
Ketones, ur: NEGATIVE mg/dL
Leukocytes,Ua: NEGATIVE
Nitrite: NEGATIVE
Protein, ur: NEGATIVE mg/dL
Specific Gravity, Urine: 1.03 (ref 1.005–1.030)
pH: 6 (ref 5.0–8.0)

## 2021-08-30 MED ORDER — DICLOFENAC SODIUM 1 % EX GEL
2.0000 g | Freq: Four times a day (QID) | CUTANEOUS | 0 refills | Status: DC | PRN
  Filled 2021-08-30: qty 100, 13d supply, fill #0

## 2021-08-30 MED ORDER — CEPHALEXIN 500 MG PO CAPS: 1000.0000 mg | ORAL_CAPSULE | Freq: Two times a day (BID) | ORAL | 0 refills | Status: AC

## 2021-08-30 MED ORDER — METHOCARBAMOL 500 MG PO TABS
500.0000 mg | ORAL_TABLET | Freq: Three times a day (TID) | ORAL | 0 refills | Status: DC | PRN
  Filled 2021-08-30: qty 20, 7d supply, fill #0

## 2021-08-30 NOTE — ED Triage Notes (Signed)
Pt states he has some frequency with urination x 2 weeks.  Yesterday he started to have bilateral flank pain with burning urination.

## 2021-08-30 NOTE — ED Provider Notes (Signed)
Emergency Department Provider Note   I have reviewed the triage vital signs and the nursing notes.   HISTORY  Chief Complaint Dysuria and Flank Pain   HPI Brandon Burns is a 75 y.o. male past medical history of kidney stones as well as elevated blood pressure presents to the emergency department with bilateral lower back pain with some dysuria.  Dysuria symptoms have been ongoing for the past 2 weeks.  He had some associated frequency.  He felt like he is probably getting urinary tract infection but has not had fevers or shaking chills which have been the case in the past.  He developed some bilateral lower back pain, slightly worse on the right over the past 24 to 48 hours.  Denies any radiation of pain down the legs.  No numbness or weakness.  No urethral discharge.   Past Medical History:  Diagnosis Date   Hypertension    Kidney stone     There are no problems to display for this patient.   Past Surgical History:  Procedure Laterality Date   BACK SURGERY     CHOLECYSTECTOMY     HEMORRHOID SURGERY      Allergies Patient has no known allergies.  No family history on file.  Social History Social History   Tobacco Use   Smoking status: Never   Smokeless tobacco: Never  Substance Use Topics   Alcohol use: No   Drug use: No    Review of Systems  Constitutional: No fever/chills Eyes: No visual changes. ENT: No sore throat. Cardiovascular: Denies chest pain. Respiratory: Denies shortness of breath. Gastrointestinal: No abdominal pain.  No nausea, no vomiting.  No diarrhea.  No constipation. Genitourinary: Positive for dysuria and urine frequency.  Musculoskeletal: Positive for back pain. Skin: Negative for rash. Neurological: Negative for headaches, focal weakness or numbness.  10-point ROS otherwise negative.  ____________________________________________   PHYSICAL EXAM:  VITAL SIGNS: ED Triage Vitals [08/30/21 0841]  Enc Vitals Group     BP       Pulse Rate 72     Resp      Temp      Temp src      SpO2 96 %     Weight 235 lb (106.6 kg)     Height 6\' 1"  (1.854 m)     Head Circumference      Peak Flow      Pain Score 6     Pain Loc      Pain Edu?      Excl. in GC?    Constitutional: Alert and oriented. Well appearing and in no acute distress. Eyes: Conjunctivae are normal.  Head: Atraumatic. Nose: No congestion/rhinnorhea. Mouth/Throat: Mucous membranes are moist.  Neck: No stridor.   Cardiovascular: Normal rate, regular rhythm. Good peripheral circulation. Grossly normal heart sounds.   Respiratory: Normal respiratory effort.  No retractions. Lungs CTAB. Gastrointestinal: Soft and nontender. No distention. No CVA tenderness.  Musculoskeletal: No lower extremity tenderness nor edema. No gross deformities of extremities. Neurologic:  Normal speech and language. No gross focal neurologic deficits are appreciated.  Skin:  Skin is warm, dry and intact. No rash noted.  ____________________________________________   LABS (all labs ordered are listed, but only abnormal results are displayed)  Labs Reviewed  URINALYSIS, ROUTINE W REFLEX MICROSCOPIC    ____________________________________________  RADIOLOGY  CT renal stone study reviewed.   ____________________________________________   PROCEDURES  Procedure(s) performed:   Procedures  None ____________________________________________   INITIAL IMPRESSION /  ASSESSMENT AND PLAN / ED COURSE  Pertinent labs & imaging results that were available during my care of the patient were reviewed by me and considered in my medical decision making (see chart for details).   Patient presents to the emergency department for evaluation of lower back pain and urine frequency with mild dysuria.  UA shows no evidence of infection.  Vital signs are reassuring.  Abdomen is soft and nontender.  No CVA tenderness to suspect pyelonephritis.  Plan for CT renal to evaluate for stone.    CT without acute findings. UA negative for infection. Plan for watch and wait Rx for UTI should symptoms worsen. Treating back pain as MSK. Discussed drowsy side effects of Robaxin.  ____________________________________________  FINAL CLINICAL IMPRESSION(S) / ED DIAGNOSES  Final diagnoses:  Dysuria  Acute bilateral low back pain without sciatica      NEW OUTPATIENT MEDICATIONS STARTED DURING THIS VISIT:  Discharge Medication List as of 08/30/2021  9:57 AM     START taking these medications   Details  cephALEXin (KEFLEX) 500 MG capsule Take 2 capsules (1,000 mg total) by mouth 2 (two) times daily for 7 days., Starting Mon 08/30/2021, Until Mon 09/06/2021, Print    diclofenac Sodium (VOLTAREN) 1 % GEL Apply 2 g topically 4 (four) times daily as needed., Starting Mon 08/30/2021, Normal    methocarbamol (ROBAXIN) 500 MG tablet Take 1 tablet (500 mg total) by mouth every 8 (eight) hours as needed for muscle spasms., Starting Mon 08/30/2021, Normal        Note:  This document was prepared using Dragon voice recognition software and may include unintentional dictation errors.  Alona Bene, MD, [facility]    Chirstopher Iovino, Arlyss Repress, MD 09/03/21 [phone]

## 2021-08-30 NOTE — Discharge Instructions (Signed)
You were seen in the emergency room today with lower back pain and urine frequency.  I have called over to medicines to help with your lower back discomfort.  The Robaxin may cause drowsiness and you should not take it if you be driving or with other sedating medications.  I have also called in a watch and wait prescription for antibiotics.  Please do not fill this yet but if you develop fever, chills, worsening urine symptoms you can start the antibiotic treatment.

## 2021-09-02 DIAGNOSIS — I700 Atherosclerosis of aorta: Secondary | ICD-10-CM

## 2021-09-02 HISTORY — DX: Atherosclerosis of aorta: I70.0

## 2021-11-12 ENCOUNTER — Ambulatory Visit: Payer: Medicare Other

## 2021-12-10 HISTORY — PX: REPLACEMENT TOTAL KNEE: SUR1224

## 2022-04-20 DIAGNOSIS — N401 Enlarged prostate with lower urinary tract symptoms: Secondary | ICD-10-CM

## 2022-04-20 HISTORY — DX: Benign prostatic hyperplasia with lower urinary tract symptoms: N40.1

## 2022-04-30 ENCOUNTER — Other Ambulatory Visit: Payer: Self-pay

## 2022-04-30 ENCOUNTER — Encounter (HOSPITAL_BASED_OUTPATIENT_CLINIC_OR_DEPARTMENT_OTHER): Payer: Self-pay | Admitting: Emergency Medicine

## 2022-04-30 DIAGNOSIS — R300 Dysuria: Secondary | ICD-10-CM | POA: Diagnosis present

## 2022-04-30 DIAGNOSIS — R000 Tachycardia, unspecified: Secondary | ICD-10-CM | POA: Insufficient documentation

## 2022-04-30 DIAGNOSIS — R109 Unspecified abdominal pain: Secondary | ICD-10-CM | POA: Insufficient documentation

## 2022-04-30 DIAGNOSIS — N390 Urinary tract infection, site not specified: Secondary | ICD-10-CM | POA: Diagnosis not present

## 2022-04-30 LAB — URINALYSIS, MICROSCOPIC (REFLEX)

## 2022-04-30 LAB — URINALYSIS, ROUTINE W REFLEX MICROSCOPIC
Bilirubin Urine: NEGATIVE
Glucose, UA: NEGATIVE mg/dL
Ketones, ur: NEGATIVE mg/dL
Nitrite: NEGATIVE
Protein, ur: NEGATIVE mg/dL
Specific Gravity, Urine: 1.02 (ref 1.005–1.030)
pH: 7 (ref 5.0–8.0)

## 2022-04-30 NOTE — ED Triage Notes (Signed)
Pt c/o frequent urination, burning with urination, low back pain, chills and abdominal pain onset this evening.

## 2022-05-01 ENCOUNTER — Encounter (HOSPITAL_BASED_OUTPATIENT_CLINIC_OR_DEPARTMENT_OTHER): Payer: Self-pay

## 2022-05-01 ENCOUNTER — Emergency Department (HOSPITAL_BASED_OUTPATIENT_CLINIC_OR_DEPARTMENT_OTHER): Payer: PPO

## 2022-05-01 ENCOUNTER — Emergency Department (HOSPITAL_BASED_OUTPATIENT_CLINIC_OR_DEPARTMENT_OTHER)
Admission: EM | Admit: 2022-05-01 | Discharge: 2022-05-01 | Disposition: A | Discharge status: Home / Self Care | Payer: PPO | Attending: Emergency Medicine | Admitting: Emergency Medicine

## 2022-05-01 DIAGNOSIS — N390 Urinary tract infection, site not specified: Secondary | ICD-10-CM

## 2022-05-01 HISTORY — DX: Chronic kidney disease, unspecified: N18.9

## 2022-05-01 LAB — CBC WITH DIFFERENTIAL/PLATELET
Abs Immature Granulocytes: 0.08 10*3/uL — ABNORMAL HIGH (ref 0.00–0.07)
Basophils Absolute: 0.1 10*3/uL (ref 0.0–0.1)
Basophils Relative: 0 %
Eosinophils Absolute: 0.2 10*3/uL (ref 0.0–0.5)
Eosinophils Relative: 1 %
HCT: 42.1 % (ref 39.0–52.0)
Hemoglobin: 14.2 g/dL (ref 13.0–17.0)
Immature Granulocytes: 1 %
Lymphocytes Relative: 7 %
Lymphs Abs: 1.2 10*3/uL (ref 0.7–4.0)
MCH: 28.9 pg (ref 26.0–34.0)
MCHC: 33.7 g/dL (ref 30.0–36.0)
MCV: 85.7 fL (ref 80.0–100.0)
Monocytes Absolute: 1.6 10*3/uL — ABNORMAL HIGH (ref 0.1–1.0)
Monocytes Relative: 10 %
Neutro Abs: 13.5 10*3/uL — ABNORMAL HIGH (ref 1.7–7.7)
Neutrophils Relative %: 81 %
Platelets: 238 10*3/uL (ref 150–400)
RBC: 4.91 MIL/uL (ref 4.22–5.81)
RDW: 14.5 % (ref 11.5–15.5)
WBC: 16.6 10*3/uL — ABNORMAL HIGH (ref 4.0–10.5)
nRBC: 0 % (ref 0.0–0.2)

## 2022-05-01 LAB — BASIC METABOLIC PANEL
Anion gap: 6 (ref 5–15)
BUN: 18 mg/dL (ref 8–23)
CO2: 22 mmol/L (ref 22–32)
Calcium: 9.1 mg/dL (ref 8.9–10.3)
Chloride: 107 mmol/L (ref 98–111)
Creatinine, Ser: 1.41 mg/dL — ABNORMAL HIGH (ref 0.61–1.24)
GFR, Estimated: 52 mL/min — ABNORMAL LOW (ref >60)
Glucose, Bld: 128 mg/dL — ABNORMAL HIGH (ref 70–99)
Potassium: 3.9 mmol/L (ref 3.5–5.1)
Sodium: 135 mmol/L (ref 135–145)

## 2022-05-01 LAB — LACTIC ACID, PLASMA: Lactic Acid, Venous: 1.4 mmol/L (ref 0.5–1.9)

## 2022-05-01 MED ORDER — IOHEXOL 300 MG/ML  SOLN
100.0000 mL | Freq: Once | INTRAMUSCULAR | Status: AC | PRN
  Administered 2022-05-01: 100 mL via INTRAVENOUS

## 2022-05-01 MED ORDER — ACETAMINOPHEN 325 MG PO TABS
650.0000 mg | ORAL_TABLET | Freq: Once | ORAL | Status: AC
  Administered 2022-05-01: 650 mg via ORAL
  Filled 2022-05-01: qty 2

## 2022-05-01 MED ORDER — LACTATED RINGERS IV BOLUS
1000.0000 mL | Freq: Once | INTRAVENOUS | Status: AC
  Administered 2022-05-01: 1000 mL via INTRAVENOUS

## 2022-05-01 MED ORDER — SODIUM CHLORIDE 0.9 % IV SOLN: INTRAVENOUS | Status: DC | PRN

## 2022-05-01 MED ORDER — CEPHALEXIN 500 MG PO CAPS: 500.0000 mg | ORAL_CAPSULE | Freq: Two times a day (BID) | ORAL | 0 refills | Status: AC

## 2022-05-01 MED ORDER — SODIUM CHLORIDE 0.9 % IV SOLN
1.0000 g | Freq: Once | INTRAVENOUS | Status: AC
  Administered 2022-05-01: 1 g via INTRAVENOUS
  Filled 2022-05-01: qty 10

## 2022-05-01 NOTE — ED Provider Notes (Signed)
[facility] EMERGENCY DEPARTMENT  Provider Note  CSN: [id number] Arrival date & time: 04/30/22 2310  History Chief Complaint  Patient presents with   Urinary Frequency    Brandon Burns is a 76 y.o. male reports several hours of urinary frequency, dysuria, chills, and abdominal discomfort. He has had some nausea but no vomiting. He reports low back pain but attributed that to playing golf recently. He states it feels like his prostate is putting pressure on his bladder. No difficulty with BM. No cough, congestion.    Home Medications Prior to Admission medications   Medication Sig Start Date End Date Taking? Authorizing Provider  cephALEXin (KEFLEX) 500 MG capsule Take 1 capsule (500 mg total) by mouth 2 (two) times daily for 7 days. 05/01/22 05/08/22 Yes Pollyann Savoy, MD  cholecalciferol (VITAMIN D) 1000 UNITS tablet Take 2,000 Units by mouth daily.    [provider]  diclofenac Sodium (VOLTAREN) 1 % GEL Apply 2 grams topically 4 (four) times daily as needed. 08/30/21   Long, Arlyss Repress, MD  methocarbamol (ROBAXIN) 500 MG tablet Take 1 tablet (500 mg total) by mouth every 8 (eight) hours as needed for muscle spasms. 08/30/21   Long, Arlyss Repress, MD  omeprazole (PRILOSEC) 20 MG capsule Take 20 mg by mouth daily.    [provider]  traMADol (ULTRAM) 50 MG tablet Take 50 mg by mouth every 8 (eight) hours as needed. 08/10/21   [provider]  zolpidem (AMBIEN) 10 MG tablet Take 10 mg by mouth at bedtime as needed. For sleep    [provider]     Allergies    Patient has no known allergies.   Review of Systems   Review of Systems Please see HPI for pertinent positives and negatives  Physical Exam BP 139/72 (BP Location: Right Arm)   Pulse 83   Temp 98.5 F (36.9 C) (Oral)   Resp 20   Ht 6\' 1"  (1.854 m)   Wt 101.6 kg   SpO2 100%   BMI 29.55 kg/m   Physical Exam Vitals and nursing note reviewed.  Constitutional:       Appearance: Normal appearance.  HENT:     Head: Normocephalic and atraumatic.     Nose: Nose normal.     Mouth/Throat:     Mouth: Mucous membranes are moist.  Eyes:     Extraocular Movements: Extraocular movements intact.     Conjunctiva/sclera: Conjunctivae normal.  Cardiovascular:     Rate and Rhythm: Tachycardia present.  Pulmonary:     Effort: Pulmonary effort is normal.     Breath sounds: Normal breath sounds.  Abdominal:     General: Abdomen is flat.     Palpations: Abdomen is soft.     Tenderness: There is no abdominal tenderness.  Musculoskeletal:        General: No swelling. Normal range of motion.     Cervical back: Neck supple.  Skin:    General: Skin is warm and dry.  Neurological:     General: No focal deficit present.     Mental Status: He is alert.  Psychiatric:        Mood and Affect: Mood normal.    ED Results / Procedures / Treatments   EKG None  Procedures Procedures  Medications Ordered in the ED Medications  0.9 %  sodium chloride infusion (0 mLs Intravenous Stopped 05/01/22 0325)  acetaminophen (TYLENOL) tablet 650 mg (650 mg Oral Given 05/01/22 0045)  lactated ringers bolus 1,000 mL ( Intravenous Stopped 05/01/22 0150)  iohexol (OMNIPAQUE) 300 MG/ML solution 100 mL (100 mLs Intravenous Contrast Given 05/01/22 0201)  cefTRIAXone (ROCEPHIN) 1 g in sodium chloride 0.9 % 100 mL IVPB (0 g Intravenous Stopped 05/01/22 0356)    Initial Impression and Plan  Patient with LUTS now with fever and tachycardia. Labs, cultures and IVF ordered. Will check CT to eval pyelonephritis or prostatitis.  ED Course   Clinical Course as of 05/01/22 0417  Sun May 01, 2022  0050 UA is consistent with suspected UTI.  [CS]  0056 CBC with elevated WBC.  [CS]  0106 BMP with CKD, improved from previous 9 years ago. Lactic acid is normal.  [CS]  0330 I personally viewed the images from radiology studies and agree with radiologist interpretation: CT neg for acute  complications of UTI. Patient is feeling better and would like to go home after completion of Abx. Plan Rx for Keflex. PCP follow up.   [CS]    Clinical Course User Index [CS] Pollyann Savoy, MD     MDM Rules/Calculators/A&P Medical Decision Making Given presenting complaint, I considered that admission might be necessary. After review of results from ED lab and/or imaging studies, admission to the hospital is not indicated at this time.    Problems Addressed: Lower urinary tract infectious disease: acute illness or injury  Amount and/or Complexity of Data Reviewed Labs: ordered. Decision-making details documented in ED Course. Radiology: ordered and independent interpretation performed. Decision-making details documented in ED Course.  Risk OTC drugs. Prescription drug management. Decision regarding hospitalization.    Final Clinical Impression(s) / ED Diagnoses Final diagnoses:  Lower urinary tract infectious disease    Rx / DC Orders ED Discharge Orders          Ordered    cephALEXin (KEFLEX) 500 MG capsule  2 times daily        05/01/22 0331             Pollyann Savoy, MD 05/01/22 [phone]

## 2022-05-03 LAB — URINE CULTURE: Culture: >=100000 — AB

## 2022-05-04 ENCOUNTER — Telehealth: Payer: Self-pay

## 2022-05-04 NOTE — Telephone Encounter (Signed)
Post ED Visit - Positive Culture Follow-up  Culture report reviewed by antimicrobial stewardship pharmacist: [facility] [x]  , Pharm.D. []  Valeda Malm, Pharm.D., BCPS AQ-ID []  , Pharm.D., BCPS []  Celedonio Miyamoto, Pharm.D., BCPS []  [place], Garvin Fila.D., BCPS, AAHIVP []  , Pharm.D., BCPS, AAHIVP []  Georgina Pillion, PharmD, BCPS []  , PharmD, BCPS []  [place], PharmD, BCPS []  [address], PharmD []  , PharmD, BCPS []  Estella Husk, PharmD  Pharmacy Team []  Lysle Pearl, PharmD []  , PharmD []  Phillips Climes, PharmD []  , Rph []  Agapito Games) , PharmD []  Verlan Friends, PharmD []  , PharmD []  Mervyn Gay, PharmD []  , PharmD []  Vinnie Level, PharmD []  [facility], PharmD []  , PharmD []  Len Childs, PharmD   Positive urine culture Treated with Cephalexin, organism sensitive to the same and no further patient follow-up is required at this time.  05/04/2022, 9:47 AM

## 2022-05-06 LAB — CULTURE, BLOOD (ROUTINE X 2)
Culture: NO GROWTH
Culture: NO GROWTH
Special Requests: ADEQUATE

## 2022-08-16 DIAGNOSIS — N390 Urinary tract infection, site not specified: Secondary | ICD-10-CM | POA: Insufficient documentation

## 2022-11-07 DIAGNOSIS — L851 Acquired keratosis [keratoderma] palmaris et plantaris: Secondary | ICD-10-CM | POA: Insufficient documentation

## 2022-11-07 DIAGNOSIS — M21621 Bunionette of right foot: Secondary | ICD-10-CM | POA: Insufficient documentation

## 2022-12-29 ENCOUNTER — Other Ambulatory Visit: Payer: Self-pay

## 2022-12-29 ENCOUNTER — Emergency Department (HOSPITAL_BASED_OUTPATIENT_CLINIC_OR_DEPARTMENT_OTHER): Payer: PPO

## 2022-12-29 ENCOUNTER — Encounter (HOSPITAL_BASED_OUTPATIENT_CLINIC_OR_DEPARTMENT_OTHER): Payer: Self-pay | Admitting: Emergency Medicine

## 2022-12-29 ENCOUNTER — Emergency Department (HOSPITAL_BASED_OUTPATIENT_CLINIC_OR_DEPARTMENT_OTHER)
Admission: EM | Admit: 2022-12-29 | Discharge: 2022-12-29 | Disposition: A | Discharge status: Home / Self Care | Payer: PPO | Attending: Emergency Medicine | Admitting: Emergency Medicine

## 2022-12-29 ENCOUNTER — Other Ambulatory Visit (HOSPITAL_BASED_OUTPATIENT_CLINIC_OR_DEPARTMENT_OTHER): Payer: Self-pay

## 2022-12-29 DIAGNOSIS — U071 COVID-19: Secondary | ICD-10-CM | POA: Diagnosis not present

## 2022-12-29 DIAGNOSIS — I129 Hypertensive chronic kidney disease with stage 1 through stage 4 chronic kidney disease, or unspecified chronic kidney disease: Secondary | ICD-10-CM | POA: Insufficient documentation

## 2022-12-29 DIAGNOSIS — R059 Cough, unspecified: Secondary | ICD-10-CM | POA: Diagnosis present

## 2022-12-29 DIAGNOSIS — N189 Chronic kidney disease, unspecified: Secondary | ICD-10-CM | POA: Insufficient documentation

## 2022-12-29 LAB — RESP PANEL BY RT-PCR (RSV, FLU A&B, COVID)  RVPGX2
Influenza A by PCR: NEGATIVE
Influenza B by PCR: NEGATIVE
Resp Syncytial Virus by PCR: NEGATIVE
SARS Coronavirus 2 by RT PCR: POSITIVE — AB

## 2022-12-29 MED ORDER — BENZONATATE 100 MG PO CAPS
100.0000 mg | ORAL_CAPSULE | Freq: Three times a day (TID) | ORAL | 0 refills | Status: DC
  Filled 2022-12-29: qty 21, 7d supply, fill #0

## 2022-12-29 MED ORDER — ACETAMINOPHEN 500 MG PO TABS
500.0000 mg | ORAL_TABLET | Freq: Four times a day (QID) | ORAL | 0 refills | Status: DC | PRN
  Filled 2022-12-29: qty 100, 25d supply, fill #0

## 2022-12-29 NOTE — ED Provider Notes (Signed)
[place] [facility] Provider Note   CSN: [id number] Arrival date & time: 12/29/22  1102     History  Chief Complaint  Patient presents with   URI    Brandon Burns is a 77 y.o. male.  The history is provided by the patient and medical records. No language interpreter was used.  URI    77 year old male significant history of chronic kidney disease, hypertension, presenting with cold symptoms.  Patient report for the past several weeks he has had this lingering cough.  Cough occasionally productive with phlegm.  He reported he was seen by his PCP and was prescribed antibiotics which provide some relief.  This is several weeks prior.  He still cannot get rid of the cough and for the past week he noticed increasing cough, some congestion, and some bodyaches without any fever.  He does not endorse any nausea vomiting diarrhea denies any significant shortness of breath.  Patient denies alcohol or tobacco use.  Home Medications Prior to Admission medications   Medication Sig Start Date End Date Taking? Authorizing Provider  cholecalciferol (VITAMIN D) 1000 UNITS tablet Take 2,000 Units by mouth daily.    [provider]  diclofenac Sodium (VOLTAREN) 1 % GEL Apply 2 grams topically 4 (four) times daily as needed. 08/30/21   Long, Arlyss Repress, MD  methocarbamol (ROBAXIN) 500 MG tablet Take 1 tablet (500 mg total) by mouth every 8 (eight) hours as needed for muscle spasms. 08/30/21   Long, Arlyss Repress, MD  omeprazole (PRILOSEC) 20 MG capsule Take 20 mg by mouth daily.    [provider]  traMADol (ULTRAM) 50 MG tablet Take 50 mg by mouth every 8 (eight) hours as needed. 08/10/21   [provider]  zolpidem (AMBIEN) 10 MG tablet Take 10 mg by mouth at bedtime as needed. For sleep    [provider]      Allergies    Patient has no known allergies.    Review of Systems   Review of Systems  All other systems reviewed and are  negative.   Physical Exam Updated Vital Signs BP (!) 150/85 (BP Location: Left Arm)   Pulse 82   Temp 99.2 F (37.3 C) (Oral)   Resp 20   Ht 6\' 1"  (1.854 m)   Wt 98.4 kg   SpO2 98%   BMI 28.63 kg/m  Physical Exam Vitals and nursing note reviewed.  Constitutional:      General: He is not in acute distress.    Appearance: He is well-developed.  HENT:     Head: Atraumatic.  Eyes:     Conjunctiva/sclera: Conjunctivae normal.  Cardiovascular:     Rate and Rhythm: Normal rate and regular rhythm.     Pulses: Normal pulses.     Heart sounds: Normal heart sounds.  Pulmonary:     Effort: Pulmonary effort is normal.     Breath sounds: Normal breath sounds. No wheezing, rhonchi or rales.  Abdominal:     Palpations: Abdomen is soft.     Tenderness: There is no abdominal tenderness.  Musculoskeletal:     Cervical back: Neck supple.  Skin:    Findings: No rash.  Neurological:     Mental Status: He is alert. Mental status is at baseline.     ED Results / Procedures / Treatments   Labs (all labs ordered are listed, but only abnormal results are displayed) Labs Reviewed  RESP PANEL BY RT-PCR (RSV, FLU  A&B, COVID)  [id number] - Abnormal; Notable for the following components:      Result Value   SARS Coronavirus 2 by RT PCR POSITIVE (*)    All other components within normal limits    EKG None  Radiology DG Chest 2 View  Result Date: 12/29/2022 CLINICAL DATA:  Cough. EXAM: CHEST - 2 VIEW COMPARISON:  None Available. FINDINGS: Clear lungs. Normal heart size and mediastinal contours. No pleural effusion or pneumothorax. Visualized bones and upper abdomen are unremarkable. IMPRESSION: No evidence of acute cardiopulmonary disease. Electronically Signed   By: Emmit Alexanders M.D.   On: 12/29/2022 12:02    Procedures Procedures    Medications Ordered in ED Medications - No data to display  ED Course/ Medical Decision Making/ A&P                             Medical Decision  Making Amount and/or Complexity of Data Reviewed Radiology: ordered.   BP (!) 150/85 (BP Location: Left Arm)   Pulse 82   Temp 99.6 F (37.6 C) (Oral)   Resp 20   Ht 6\' 1"  (1.854 m)   Wt 98.4 kg   SpO2 98%   BMI 28.63 kg/m   34:8 PM 77 year old male significant history of chronic kidney disease, hypertension, presenting with cold symptoms.  Patient report for the past several weeks he has had this lingering cough.  Cough occasionally productive with phlegm.  He reported he was seen by his PCP and was prescribed antibiotics which provide some relief.  This is several weeks prior.  He still cannot get rid of the cough and for the past week he noticed increasing cough, some congestion, and some bodyaches without any fever.  He does not endorse any nausea vomiting diarrhea denies any significant shortness of breath.  Patient denies alcohol or tobacco use.  On exam this is an elderly male well-appearing sitting in the chair appears to be in no acute discomfort.  He is able to speak in complete sentences.  Heart with normal rate and rhythm, lungs are clear to auscultation bilaterally, normal skin turgor, and nose and throat exam unremarkable.  -Labs ordered, independently viewed and interpreted by me.  Labs remarkable for covid positive -The patient was maintained on a cardiac monitor.  I personally viewed and interpreted the cardiac monitored which showed an underlying rhythm of: NSR -Imaging independently viewed and interpreted by me and I agree with radiologist's interpretation.  Result remarkable for CXR showing no acute changes -This patient presents to the ED for concern of cold sxs, this involves an extensive number of treatment options, and is a complaint that carries with it a high risk of complications and morbidity.  The differential diagnosis includes covid, flu, rsv, pna -Co morbidities that complicate the patient evaluation includes HTN, CKD -Treatment includes supportive  care -Reevaluation of the patient after these medicines showed that the patient improved -PCP office notes or outside notes reviewed -Discussion with specialist attending DR. Trifan -Escalation to admission/observation considered: patients feels much better, is comfortable with discharge, and will follow up with PCP -Prescription medication considered, patient comfortable with tylenol, tessalon -Social Determinant of Health considered           Final Clinical Impression(s) / ED Diagnoses Final diagnoses:  COVID    Rx / DC Orders ED Discharge Orders          Ordered    benzonatate (TESSALON) 100 MG capsule  Every 8 hours        12/29/22 1332    acetaminophen (TYLENOL) 500 MG tablet  Every 6 hours PRN        12/29/22 1332              Domenic Moras, PA-C 12/29/22 1333    Trifan, Carola Rhine, MD 12/29/22 [phone]

## 2022-12-29 NOTE — ED Notes (Signed)
Pt discharged to home. Discharge instructions have been discussed with patient and/or family members. Pt verbally acknowledges understanding d/c instructions, and endorses comprehension to checkout at registration before leaving.  °

## 2022-12-29 NOTE — Discharge Instructions (Signed)
Recommendations for at home COVID-19 symptoms management:  Please continue isolation at home.  If have acute worsening of symptoms please go to ER/urgent care for further evaluation. Check pulse oximetry and if below 90-92% please go to ER. The following supplements MAY help:  Vitamin C 500mg twice a day and Quercetin 250-500 mg twice a day Vitamin D3 2000 - 4000 u/day B Complex vitamins Zinc 75-100 mg/day Melatonin 6-10 mg at night (the optimal dose is unknown) Aspirin 81mg/day (if no history of bleeding issues)  

## 2022-12-29 NOTE — ED Triage Notes (Signed)
Pt states he has sinus drainage.  Cough, runny nose, no body aches, no fever.  No N/V/D.  Pt seen by PCP about 3 weeks ago for same, was given antibiotics but he is not improving.

## 2023-01-26 ENCOUNTER — Encounter (HOSPITAL_BASED_OUTPATIENT_CLINIC_OR_DEPARTMENT_OTHER): Payer: Self-pay | Admitting: Emergency Medicine

## 2023-01-26 ENCOUNTER — Emergency Department (HOSPITAL_BASED_OUTPATIENT_CLINIC_OR_DEPARTMENT_OTHER): Payer: PPO

## 2023-01-26 ENCOUNTER — Inpatient Hospital Stay (HOSPITAL_BASED_OUTPATIENT_CLINIC_OR_DEPARTMENT_OTHER)
Admission: EM | Admit: 2023-01-26 | Discharge: 2023-01-28 | DRG: 872 | Disposition: A | Discharge status: Home / Self Care | Payer: PPO | Attending: Internal Medicine | Admitting: Internal Medicine

## 2023-01-26 ENCOUNTER — Other Ambulatory Visit: Payer: Self-pay

## 2023-01-26 DIAGNOSIS — A419 Sepsis, unspecified organism: Secondary | ICD-10-CM

## 2023-01-26 DIAGNOSIS — G4700 Insomnia, unspecified: Secondary | ICD-10-CM | POA: Diagnosis present

## 2023-01-26 DIAGNOSIS — Z96652 Presence of left artificial knee joint: Secondary | ICD-10-CM | POA: Diagnosis present

## 2023-01-26 DIAGNOSIS — I700 Atherosclerosis of aorta: Secondary | ICD-10-CM | POA: Diagnosis present

## 2023-01-26 DIAGNOSIS — N10 Acute tubulo-interstitial nephritis: Secondary | ICD-10-CM | POA: Diagnosis present

## 2023-01-26 DIAGNOSIS — N12 Tubulo-interstitial nephritis, not specified as acute or chronic: Secondary | ICD-10-CM | POA: Diagnosis not present

## 2023-01-26 DIAGNOSIS — R6520 Severe sepsis without septic shock: Secondary | ICD-10-CM | POA: Diagnosis present

## 2023-01-26 DIAGNOSIS — I129 Hypertensive chronic kidney disease with stage 1 through stage 4 chronic kidney disease, or unspecified chronic kidney disease: Secondary | ICD-10-CM | POA: Diagnosis present

## 2023-01-26 DIAGNOSIS — E860 Dehydration: Secondary | ICD-10-CM | POA: Diagnosis present

## 2023-01-26 DIAGNOSIS — M545 Low back pain, unspecified: Secondary | ICD-10-CM | POA: Insufficient documentation

## 2023-01-26 DIAGNOSIS — E876 Hypokalemia: Secondary | ICD-10-CM | POA: Diagnosis present

## 2023-01-26 DIAGNOSIS — F5104 Psychophysiologic insomnia: Secondary | ICD-10-CM | POA: Diagnosis present

## 2023-01-26 DIAGNOSIS — N401 Enlarged prostate with lower urinary tract symptoms: Secondary | ICD-10-CM | POA: Diagnosis present

## 2023-01-26 DIAGNOSIS — Z1152 Encounter for screening for COVID-19: Secondary | ICD-10-CM | POA: Diagnosis not present

## 2023-01-26 DIAGNOSIS — N179 Acute kidney failure, unspecified: Secondary | ICD-10-CM | POA: Diagnosis present

## 2023-01-26 DIAGNOSIS — N1831 Chronic kidney disease, stage 3a: Secondary | ICD-10-CM | POA: Diagnosis present

## 2023-01-26 DIAGNOSIS — Z79899 Other long term (current) drug therapy: Secondary | ICD-10-CM

## 2023-01-26 DIAGNOSIS — K219 Gastro-esophageal reflux disease without esophagitis: Secondary | ICD-10-CM | POA: Diagnosis present

## 2023-01-26 DIAGNOSIS — N183 Chronic kidney disease, stage 3 unspecified: Secondary | ICD-10-CM | POA: Diagnosis present

## 2023-01-26 DIAGNOSIS — I10 Essential (primary) hypertension: Secondary | ICD-10-CM | POA: Diagnosis present

## 2023-01-26 DIAGNOSIS — E782 Mixed hyperlipidemia: Secondary | ICD-10-CM | POA: Diagnosis present

## 2023-01-26 LAB — URINALYSIS, ROUTINE W REFLEX MICROSCOPIC
Bilirubin Urine: NEGATIVE
Glucose, UA: NEGATIVE mg/dL
Ketones, ur: NEGATIVE mg/dL
Nitrite: POSITIVE — AB
Protein, ur: 100 mg/dL — AB
Specific Gravity, Urine: 1.025 (ref 1.005–1.030)
pH: 5.5 (ref 5.0–8.0)

## 2023-01-26 LAB — CBC
HCT: 42.5 % (ref 39.0–52.0)
Hemoglobin: 14.4 g/dL (ref 13.0–17.0)
MCH: 29.6 pg (ref 26.0–34.0)
MCHC: 33.9 g/dL (ref 30.0–36.0)
MCV: 87.4 fL (ref 80.0–100.0)
Platelets: 215 10*3/uL (ref 150–400)
RBC: 4.86 MIL/uL (ref 4.22–5.81)
RDW: 14.2 % (ref 11.5–15.5)
WBC: 21.3 10*3/uL — ABNORMAL HIGH (ref 4.0–10.5)
nRBC: 0 % (ref 0.0–0.2)

## 2023-01-26 LAB — LACTIC ACID, PLASMA
Lactic Acid, Venous: 4.2 mmol/L (ref 0.5–1.9)
Lactic Acid, Venous: 1.4 mmol/L (ref 0.5–1.9)

## 2023-01-26 LAB — BASIC METABOLIC PANEL
Anion gap: 13 (ref 5–15)
BUN: 26 mg/dL — ABNORMAL HIGH (ref 8–23)
CO2: 24 mmol/L (ref 22–32)
Calcium: 9.7 mg/dL (ref 8.9–10.3)
Chloride: 98 mmol/L (ref 98–111)
Creatinine, Ser: 1.65 mg/dL — ABNORMAL HIGH (ref 0.61–1.24)
GFR, Estimated: 43 mL/min — ABNORMAL LOW (ref >60)
Glucose, Bld: 156 mg/dL — ABNORMAL HIGH (ref 70–99)
Potassium: 3.4 mmol/L — ABNORMAL LOW (ref 3.5–5.1)
Sodium: 135 mmol/L (ref 135–145)

## 2023-01-26 LAB — HEPATIC FUNCTION PANEL
ALT: 14 U/L (ref 0–44)
AST: 16 U/L (ref 15–41)
Albumin: 4.4 g/dL (ref 3.5–5.0)
Alkaline Phosphatase: 75 U/L (ref 38–126)
Bilirubin, Direct: 0.3 mg/dL — ABNORMAL HIGH (ref 0.0–0.2)
Indirect Bilirubin: 0.8 mg/dL (ref 0.3–0.9)
Total Bilirubin: 1.1 mg/dL (ref 0.3–1.2)
Total Protein: 7.3 g/dL (ref 6.5–8.1)

## 2023-01-26 LAB — RESP PANEL BY RT-PCR (RSV, FLU A&B, COVID)  RVPGX2
Influenza A by PCR: NEGATIVE
Influenza B by PCR: NEGATIVE
Resp Syncytial Virus by PCR: NEGATIVE
SARS Coronavirus 2 by RT PCR: NEGATIVE

## 2023-01-26 LAB — URINALYSIS, MICROSCOPIC (REFLEX)

## 2023-01-26 LAB — PHOSPHORUS: Phosphorus: 2.1 mg/dL — ABNORMAL LOW (ref 2.5–4.6)

## 2023-01-26 LAB — MAGNESIUM: Magnesium: 1.6 mg/dL — ABNORMAL LOW (ref 1.7–2.4)

## 2023-01-26 MED ORDER — POTASSIUM CHLORIDE IN NACL 20-0.9 MEQ/L-% IV SOLN
INTRAVENOUS | Status: DC
  Filled 2023-01-26 (×3): qty 1000

## 2023-01-26 MED ORDER — ACETAMINOPHEN 500 MG PO TABS
1000.0000 mg | ORAL_TABLET | Freq: Once | ORAL | Status: AC
  Administered 2023-01-26: 1000 mg via ORAL
  Filled 2023-01-26: qty 2

## 2023-01-26 MED ORDER — ZOLPIDEM TARTRATE 10 MG PO TABS
10.0000 mg | ORAL_TABLET | Freq: Every evening | ORAL | Status: DC | PRN
  Administered 2023-01-26 – 2023-01-27 (×2): 10 mg via ORAL
  Filled 2023-01-26 (×2): qty 1

## 2023-01-26 MED ORDER — ACETAMINOPHEN 650 MG RE SUPP: 650.0000 mg | Freq: Four times a day (QID) | RECTAL | Status: DC | PRN

## 2023-01-26 MED ORDER — MAGNESIUM SULFATE 2 GM/50ML IV SOLN
2.0000 g | Freq: Once | INTRAVENOUS | Status: AC
  Administered 2023-01-26: 2 g via INTRAVENOUS
  Filled 2023-01-26: qty 50

## 2023-01-26 MED ORDER — ENOXAPARIN SODIUM 40 MG/0.4ML IJ SOSY
40.0000 mg | PREFILLED_SYRINGE | INTRAMUSCULAR | Status: DC
  Administered 2023-01-26 – 2023-01-27 (×2): 40 mg via SUBCUTANEOUS
  Filled 2023-01-26 (×2): qty 0.4

## 2023-01-26 MED ORDER — SODIUM CHLORIDE 0.9 % IV SOLN
1.0000 g | Freq: Once | INTRAVENOUS | Status: AC
  Administered 2023-01-26: 1 g via INTRAVENOUS
  Filled 2023-01-26: qty 10

## 2023-01-26 MED ORDER — ONDANSETRON HCL 4 MG/2ML IJ SOLN
4.0000 mg | Freq: Once | INTRAMUSCULAR | Status: AC
  Administered 2023-01-26: 4 mg via INTRAVENOUS
  Filled 2023-01-26: qty 2

## 2023-01-26 MED ORDER — SODIUM CHLORIDE 0.9 % IV BOLUS
1000.0000 mL | Freq: Once | INTRAVENOUS | Status: AC
  Administered 2023-01-26: 1000 mL via INTRAVENOUS

## 2023-01-26 MED ORDER — POTASSIUM PHOSPHATES 15 MMOLE/5ML IV SOLN
15.0000 mmol | Freq: Once | INTRAVENOUS | Status: AC
  Administered 2023-01-26: 15 mmol via INTRAVENOUS
  Filled 2023-01-26: qty 5

## 2023-01-26 MED ORDER — SODIUM CHLORIDE 0.9 % IV SOLN
1.0000 g | INTRAVENOUS | Status: DC
  Administered 2023-01-27 – 2023-01-28 (×2): 1 g via INTRAVENOUS
  Filled 2023-01-26 (×2): qty 10

## 2023-01-26 MED ORDER — ACETAMINOPHEN 325 MG PO TABS
650.0000 mg | ORAL_TABLET | Freq: Four times a day (QID) | ORAL | Status: DC | PRN
  Administered 2023-01-26 – 2023-01-27 (×4): 650 mg via ORAL
  Filled 2023-01-26 (×4): qty 2

## 2023-01-26 NOTE — ED Triage Notes (Signed)
Symptoms started as congestion 4 days ago , dysuria and fever x 3 days , emesis x 2 days .  Right lower back he said . Hx kidney stones .

## 2023-01-26 NOTE — H&P (Signed)
History and Physical    Brandon Burns [id number] DOB: [birth year] DOA: 01/26/2023  PCP: Jefm Petty, MD  Patient coming from: [facility] ED  Chief Complaint: Fever  HPI: Brandon Burns is a 77 y.o. male with medical history significant of BPH, CKD stage IIIa, GERD, hypertension, nephrolithiasis, hyperlipidemia, history of Klebsiella and Enterococcus faecalis UTI in the past presented to ED with fever, back pain, nausea, vomiting, and urinary symptoms.  In the ED, patient noted to be febrile and slightly tachycardic.  Not hypotensive.  Labs significant for WBC 21.3, potassium 3.4, BUN 26, creatinine 1.6 (baseline 1.0-1.2), COVID/influenza/RSV PCR negative, lactic acid 4.2> 1.4 with IV fluids, magnesium 1.6, phosphorus 2.1.  UA with positive nitrite, small amount of leukocytes, and microscopy showing 21-50 RBCs, 6-10 WBCs, and few bacteria.  No urine culture collected.  Blood cultures collected.  Chest x-ray not suggestive of pneumonia.  CT renal stone study showing new left perinephric stranding.  No renal stone or hydronephrosis.  Findings may represent a recently passed stone versus ascending UTI/pyelonephritis. Patient received Tylenol, Zofran, ceftriaxone, IV magnesium 2 g, IV potassium, and 2 L IV fluids in the ED.  Patient reports 2-day history of fevers, chills, nasal and chest congestion, dysuria, urinary frequency and urgency, nausea, vomiting, and very mild low back pain.  He was having a temperature of 101 F at home.  Denies abdominal pain.  Last episode of vomiting was yesterday evening and he feels thirsty, requesting water.  Denies shortness of breath or chest pain.  He is requesting his home Ambien 10 mg at bedtime for chronic insomnia.  Review of Systems:  Review of Systems  All Brandon systems reviewed and are negative.   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 He has never used smokeless tobacco. He reports that he does not drink alcohol and does not use drugs.  No Known Allergies  History reviewed. No pertinent family history.  Prior to Admission medications   Medication Sig Start Date End Date Taking? Authorizing Provider  acetaminophen (TYLENOL) 500 MG tablet Take 1 tablet (500 mg total) by mouth every 6 (six) hours as needed. 12/29/22   Domenic Moras, PA-C  benzonatate (TESSALON) 100 MG capsule Take 1 capsule (100 mg total) by mouth every 8 (eight) hours. 12/29/22   Domenic Moras, PA-C  cholecalciferol (VITAMIN D) 1000 UNITS tablet Take 2,000 Units by mouth daily.    [provider]  diclofenac Sodium (VOLTAREN) 1 % GEL Apply 2 grams topically 4 (four) times daily as needed. 08/30/21   Long, Wonda Olds, MD  methocarbamol (ROBAXIN) 500 MG tablet Take 1 tablet (500 mg total) by mouth every 8 (eight) hours as needed for muscle spasms. 08/30/21   Long, Wonda Olds, MD  omeprazole (PRILOSEC) 20  MG capsule Take 20 mg by mouth daily.    [provider]  pravastatin (PRAVACHOL) 20 MG tablet Take 20 mg by mouth daily. 12/12/22   [provider]  tamsulosin (FLOMAX) 0.4 MG CAPS capsule Take 0.4 mg by mouth daily. 01/10/23   [provider]  traMADol (ULTRAM) 50 MG tablet Take 50 mg by mouth every 8 (eight) hours as needed. 08/10/21   [provider]   zolpidem (AMBIEN) 10 MG tablet Take 10 mg by mouth at bedtime as needed. For sleep    [provider]    Physical Exam: Vitals:   01/26/23 1400 01/26/23 1600 01/26/23 1603 01/26/23 1700  BP: (!) 144/69 (!) 145/104  (!) 161/75  Pulse: 84 (!) 101  (!) 102  Resp: 18 16  19  $ Temp: 100 F (37.8 C)  99.6 F (37.6 C) (!) 101.1 F (38.4 C)  TempSrc:   Oral   SpO2: 96% 95%  94%  Weight:      Height:        Physical Exam Vitals reviewed.  Constitutional:      General: He is not in acute distress. HENT:     Head: Normocephalic and atraumatic.     Mouth/Throat:     Mouth: Mucous membranes are dry.  Eyes:     Extraocular Movements: Extraocular movements intact.  Cardiovascular:     Rate and Rhythm: Normal rate and regular rhythm.     Pulses: Normal pulses.  Pulmonary:     Effort: Pulmonary effort is normal. No respiratory distress.     Breath sounds: Normal breath sounds. No wheezing or rales.  Abdominal:     General: Bowel sounds are normal. There is no distension.     Palpations: Abdomen is soft.     Tenderness: There is no abdominal tenderness.  Musculoskeletal:     Cervical back: Normal range of motion.     Right lower leg: No edema.     Left lower leg: No edema.  Skin:    General: Skin is warm and dry.  Neurological:     General: No focal deficit present.     Mental Status: He is alert and oriented to person, place, and time.     Labs on Admission: I have personally reviewed following labs and imaging studies  CBC: Recent Labs  Lab 01/26/23 0957  WBC 21.3*  HGB 14.4  HCT 42.5  MCV 87.4  PLT 123456   Basic Metabolic Panel: Recent Labs  Lab 01/26/23 0957  NA 135  K 3.4*  CL 98  CO2 24  GLUCOSE 156*  BUN 26*  CREATININE 1.65*  CALCIUM 9.7  MG 1.6*  PHOS 2.1*   GFR: Estimated Creatinine Clearance: 47.8 mL/min (A) (by C-G formula based on SCr of 1.65 mg/dL (H)). Liver Function Tests: Recent Labs  Lab 01/26/23 0957  AST 16  ALT 14   ALKPHOS 75  BILITOT 1.1  PROT 7.3  ALBUMIN 4.4   No results for input(s): "LIPASE", "AMYLASE" in the last 168 hours. No results for input(s): "AMMONIA" in the last 168 hours. Coagulation Profile: No results for input(s): "INR", "PROTIME" in the last 168 hours. Cardiac Enzymes: No results for input(s): "CKTOTAL", "CKMB", "CKMBINDEX", "TROPONINI" in the last 168 hours. BNP (last 3 results) No results for input(s): "PROBNP" in the last 8760 hours. HbA1C: No results for input(s): "HGBA1C" in the last 72 hours. CBG: No results for input(s): "GLUCAP" in the last 168 hours. Lipid Profile: No results for  input(s): "CHOL", "HDL", "LDLCALC", "TRIG", "CHOLHDL", "LDLDIRECT" in the last 72 hours. Thyroid Function Tests: No results for input(s): "TSH", "T4TOTAL", "FREET4", "T3FREE", "THYROIDAB" in the last 72 hours. Anemia Panel: No results for input(s): "VITAMINB12", "FOLATE", "FERRITIN", "TIBC", "IRON", "RETICCTPCT" in the last 72 hours. Urine analysis:    Component Value Date/Time   COLORURINE YELLOW 01/26/2023 0934   APPEARANCEUR CLOUDY (A) 01/26/2023 0934   LABSPEC 1.025 01/26/2023 0934   PHURINE 5.5 01/26/2023 0934   GLUCOSEU NEGATIVE 01/26/2023 0934   HGBUR MODERATE (A) 01/26/2023 0934   BILIRUBINUR NEGATIVE 01/26/2023 0934   KETONESUR NEGATIVE 01/26/2023 0934   PROTEINUR 100 (A) 01/26/2023 0934   UROBILINOGEN 0.2 06/24/2012 1535   NITRITE POSITIVE (A) 01/26/2023 0934   LEUKOCYTESUR SMALL (A) 01/26/2023 0934    Radiological Exams on Admission: CT Renal Stone Study  Result Date: 01/26/2023 CLINICAL DATA:  Flank pain, dysuria EXAM: CT ABDOMEN AND PELVIS WITHOUT CONTRAST TECHNIQUE: Multidetector CT imaging of the abdomen and pelvis was performed following the standard protocol without IV contrast. RADIATION DOSE REDUCTION: This exam was performed according to the departmental dose-optimization program which includes automated exposure control, adjustment of the mA and/or kV  according to patient size and/or use of iterative reconstruction technique. COMPARISON:  05/01/2022 FINDINGS: Lower chest: Included lung bases are clear.  Heart size is normal. Hepatobiliary: No focal liver abnormality is seen on noncontrast imaging. Liver measures 18 cm in length. Status post cholecystectomy. No biliary dilatation. Pancreas: Unremarkable. No pancreatic ductal dilatation or surrounding inflammatory changes. Spleen: Spleen measures 14 cm in length. No focal splenic abnormality. Adrenals/Urinary Tract: Unremarkable adrenal glands. New left perinephric stranding. No renal stone or hydronephrosis. No ureteral or bladder stone. Unremarkable appearance of the right kidney. Urinary bladder within normal limits for the degree of distension. Stomach/Bowel: Stomach is within normal limits. Appendix appears normal. Extensive colonic diverticulosis. No evidence of bowel wall thickening, distention, or inflammatory changes. Vascular/Lymphatic: Scattered aortoiliac atherosclerotic calcifications without aneurysm. No abdominopelvic lymphadenopathy. Reproductive: Mildly enlarged prostate gland. Brandon: No free fluid. No abdominopelvic fluid collection. No pneumoperitoneum. No abdominal wall hernia. Musculoskeletal: No new or acute bony findings. IMPRESSION: 1. New left perinephric stranding. No renal stone or hydronephrosis. Findings may represent a recently passed stone versus ascending urinary tract infection/pyelonephritis. Correlate with urinalysis. 2. Extensive colonic diverticulosis without evidence of acute diverticulitis. 3. Mild hepatosplenomegaly. 4. Aortic atherosclerosis (ICD10-I70.0). Electronically Signed   By: Davina Poke D.O.   On: 01/26/2023 10:58   DG Chest 2 View  Result Date: 01/26/2023 CLINICAL DATA:  Several day history of congestion, fever, dysuria, and emesis EXAM: CHEST - 2 VIEW COMPARISON:  Chest radiograph dated 12/29/2022 FINDINGS: Normal lung volumes. No focal consolidations. No  pleural effusion or pneumothorax. The heart size and mediastinal contours are within normal limits. The visualized skeletal structures are unremarkable. IMPRESSION: No active cardiopulmonary disease. Electronically Signed   By: Darrin Nipper M.D.   On: 01/26/2023 10:30    Assessment and Plan  Severe sepsis secondary to acute pyelonephritis Patient presenting with 2-day history of fevers, chills, urinary symptoms, and mild low back pain.  Febrile and tachycardic in the ED.  WBC count 21.3, lactate 4.2> 1.4 after 2 L IV fluids.  Not hypotensive.  UA with positive nitrite, small amount of leukocytes, and microscopy showing 21-50 RBCs, 6-10 WBCs, and few bacteria. CT renal stone study showing new left perinephric stranding.  No renal stone or hydronephrosis.  Findings may represent a recently passed stone versus ascending UTI/pyelonephritis.  Plan is to  continue ceftriaxone, IV fluid hydration, and Tylenol as needed for fevers.  Monitor WBC count.  No urine culture collected in the ED, ordered now.  Blood cultures collected.  AKI on CKD stage IIIa Likely prerenal from dehydration.  BUN 26, creatinine 1.6 (baseline 1.0-1.2).  Continue IV fluid hydration, monitor renal function, and avoid nephrotoxic agents.  Hypokalemia, hypomagnesemia, hypophosphatemia Replace and continue to monitor.  Cardiac monitoring, stat EKG.  Chronic insomnia Continue home Ambien as needed.  Hypertension Hyperlipidemia BPH GERD Pharmacy med rec pending.  DVT prophylaxis: Lovenox Code Status: Full Code (discussed with the patient) Family Communication: Wife at bedside. Level of care: Telemetry bed Admission status: It is my clinical opinion that admission to INPATIENT is reasonable and necessary because of the expectation that this patient will require hospital care that crosses at least 2 midnights to treat this condition based on the medical complexity of the problems presented.  Given the aforementioned information, the  predictability of an adverse outcome is felt to be significant.   Shela Leff MD [facility]  If 7PM-7AM, please contact night-coverage [email or web address]  01/26/2023, 5:28 PM

## 2023-01-26 NOTE — Progress Notes (Signed)
Plan of Care Note for accepted transfer   Patient: Brandon Burns MRN: [id number]   DOA: 01/26/2023  Facility requesting transfer: [facility].  Requesting Provider: Benedetto Goad PA-C and Sallee Provencal, MD. Reason for transfer: Acute left pyelonephritis. Facility course:  77 year old male with a past medical history of hypertension, hyperlipidemia, stage III CKD, vitamin D deficiency, vitamin B12 deficiency, male hypogonadism, BPH who presented to the emergency department with several of the days of dysuria, fever and emesis.  CBC showing white count of 21,000 and an imaging left kidney stranding.  He was given ceftriaxone, 1 L bolus, ondansetron 4 mg IVP and 1000 mg of acetaminophen.  Plan of care: The patient is accepted for admission to [place]  unit, at [facility]   Author: Reubin Milan, MD 01/26/2023  Check [email or web address] for on-call coverage.  Nursing staff, Please call [place] number on Amion as soon as patient's arrival, so appropriate admitting provider can evaluate the pt.

## 2023-01-26 NOTE — ED Provider Notes (Signed)
[facility] Provider Note   CSN: [id number] Arrival date & time: 01/26/23  [id number]     History  Chief Complaint  Patient presents with   Dysuria    Brandon Burns is a 77 y.o. male.  Brandon Burns is a 77 y.o. male with a history of hypertension, CKD, GERD, kidney stones, who presents to the ED for evaluation of of fever, chills, generalized malaise, congestion and dysuria.  Patient reports he started feeling poorly on Sunday evening, 2 days ago developed fevers up to 101.1 at home.  Reports Monday he had some congestion and cough but denies chest pain or shortness of breath.  Tuesday he started noticing burning and pain with urination and urinary frequency.  Reports some mild right low back pain but denies flank pain.  No abdominal pain.  Reports that yesterday he started having nausea and vomiting.  Normal bowel movements.  No blood in stool or emesis.  Reports that he recently played golf with someone that had an upper respiratory infection and initially thought this was just a cold but started to feel much worse over the past 2 days.  Reports feeling generally weak.  No body aches.   Dysuria Presenting symptoms: dysuria   Associated symptoms: fever, flank pain, nausea, urinary frequency and vomiting   Associated symptoms: no abdominal pain        Home Medications Prior to Admission medications   Medication Sig Start Date End Date Taking? Authorizing Provider  acetaminophen (TYLENOL) 500 MG tablet Take 1 tablet (500 mg total) by mouth every 6 (six) hours as needed. 12/29/22   Domenic Moras, PA-C  benzonatate (TESSALON) 100 MG capsule Take 1 capsule (100 mg total) by mouth every 8 (eight) hours. 12/29/22   Domenic Moras, PA-C  cholecalciferol (VITAMIN D) 1000 UNITS tablet Take 2,000 Units by mouth daily.    [provider]  diclofenac Sodium (VOLTAREN) 1 % GEL Apply 2 grams topically 4 (four) times daily as needed. 08/30/21   Long,  Wonda Olds, MD  methocarbamol (ROBAXIN) 500 MG tablet Take 1 tablet (500 mg total) by mouth every 8 (eight) hours as needed for muscle spasms. 08/30/21   Long, Wonda Olds, MD  omeprazole (PRILOSEC) 20 MG capsule Take 20 mg by mouth daily.    [provider]  pravastatin (PRAVACHOL) 20 MG tablet Take 20 mg by mouth daily. 12/12/22   [provider]  tamsulosin (FLOMAX) 0.4 MG CAPS capsule Take 0.4 mg by mouth daily. 01/10/23   [provider]  traMADol (ULTRAM) 50 MG tablet Take 50 mg by mouth every 8 (eight) hours as needed. 08/10/21   [provider]  zolpidem (AMBIEN) 10 MG tablet Take 10 mg by mouth at bedtime as needed. For sleep    [provider]      Allergies    Patient has no known allergies.    Review of Systems   Review of Systems  Constitutional:  Positive for chills and fever.  HENT:  Positive for congestion and rhinorrhea.   Respiratory:  Positive for cough. Negative for shortness of breath.   Cardiovascular:  Negative for chest pain.  Gastrointestinal:  Positive for nausea and vomiting. Negative for abdominal pain.  Genitourinary:  Positive for dysuria, flank pain and frequency.  Musculoskeletal:  Positive for back pain.  All other systems reviewed and are negative.   Physical Exam Updated Vital Signs BP (!) 144/69   Pulse 84   Temp  100 F (37.8 C)   Resp 18   Ht 6' 1"$  (1.854 m)   Wt 98.4 kg   SpO2 96%   BMI 28.63 kg/m  Physical Exam Vitals and nursing note reviewed.  Constitutional:      General: He is not in acute distress.    Appearance: Normal appearance. He is well-developed. He is not ill-appearing or diaphoretic.  HENT:     Head: Normocephalic and atraumatic.     Mouth/Throat:     Mouth: Mucous membranes are moist.     Pharynx: Oropharynx is clear.  Eyes:     General:        Right eye: No discharge.        Left eye: No discharge.  Cardiovascular:     Rate and Rhythm: Normal rate and regular rhythm.     Pulses:  Normal pulses.     Heart sounds: Normal heart sounds.  Pulmonary:     Effort: Pulmonary effort is normal. No respiratory distress.     Breath sounds: Normal breath sounds. No wheezing or rales.     Comments: Respirations equal and unlabored, patient able to speak in full sentences, lungs clear to auscultation bilaterally  Abdominal:     General: Bowel sounds are normal. There is no distension.     Palpations: Abdomen is soft. There is no mass.     Tenderness: There is no abdominal tenderness. There is no guarding.     Comments: Abdomen soft, nondistended, nontender to palpation in all quadrants without guarding or peritoneal signs.  Mild right low back pain but  Musculoskeletal:        General: No deformity.     Cervical back: Neck supple.  Skin:    General: Skin is warm and dry.     Capillary Refill: Capillary refill takes less than 2 seconds.  Neurological:     Mental Status: He is alert and oriented to person, place, and time.     Coordination: Coordination normal.     Comments: Speech is clear, able to follow commands Moves extremities without ataxia, coordination intact  Psychiatric:        Mood and Affect: Mood normal.        Behavior: Behavior normal.     ED Results / Procedures / Treatments   Labs (all labs ordered are listed, but only abnormal results are displayed) Labs Reviewed  URINALYSIS, ROUTINE W REFLEX MICROSCOPIC - Abnormal; Notable for the following components:      Result Value   APPearance CLOUDY (*)    Hgb urine dipstick MODERATE (*)    Protein, ur 100 (*)    Nitrite POSITIVE (*)    Leukocytes,Ua SMALL (*)    All other components within normal limits  BASIC METABOLIC PANEL - Abnormal; Notable for the following components:   Potassium 3.4 (*)    Glucose, Bld 156 (*)    BUN 26 (*)    Creatinine, Ser 1.65 (*)    GFR, Estimated 43 (*)    All other components within normal limits  CBC - Abnormal; Notable for the following components:   WBC 21.3 (*)     All other components within normal limits  LACTIC ACID, PLASMA - Abnormal; Notable for the following components:   Lactic Acid, Venous 4.2 (*)    All other components within normal limits  HEPATIC FUNCTION PANEL - Abnormal; Notable for the following components:   Bilirubin, Direct 0.3 (*)    All other components within normal limits  URINALYSIS, MICROSCOPIC (REFLEX) - Abnormal; Notable for the following components:   Bacteria, UA FEW (*)    All other components within normal limits  MAGNESIUM - Abnormal; Notable for the following components:   Magnesium 1.6 (*)    All other components within normal limits  PHOSPHORUS - Abnormal; Notable for the following components:   Phosphorus 2.1 (*)    All other components within normal limits  RESP PANEL BY RT-PCR (RSV, FLU A&B, COVID)  [id number]  CULTURE, BLOOD (ROUTINE X 2)  CULTURE, BLOOD (ROUTINE X 2)  LACTIC ACID, PLASMA    EKG None  Radiology CT Renal Stone Study  Result Date: 01/26/2023 CLINICAL DATA:  Flank pain, dysuria EXAM: CT ABDOMEN AND PELVIS WITHOUT CONTRAST TECHNIQUE: Multidetector CT imaging of the abdomen and pelvis was performed following the standard protocol without IV contrast. RADIATION DOSE REDUCTION: This exam was performed according to the departmental dose-optimization program which includes automated exposure control, adjustment of the mA and/or kV according to patient size and/or use of iterative reconstruction technique. COMPARISON:  05/01/2022 FINDINGS: Lower chest: Included lung bases are clear.  Heart size is normal. Hepatobiliary: No focal liver abnormality is seen on noncontrast imaging. Liver measures 18 cm in length. Status post cholecystectomy. No biliary dilatation. Pancreas: Unremarkable. No pancreatic ductal dilatation or surrounding inflammatory changes. Spleen: Spleen measures 14 cm in length. No focal splenic abnormality. Adrenals/Urinary Tract: Unremarkable adrenal glands. New left perinephric stranding. No  renal stone or hydronephrosis. No ureteral or bladder stone. Unremarkable appearance of the right kidney. Urinary bladder within normal limits for the degree of distension. Stomach/Bowel: Stomach is within normal limits. Appendix appears normal. Extensive colonic diverticulosis. No evidence of bowel wall thickening, distention, or inflammatory changes. Vascular/Lymphatic: Scattered aortoiliac atherosclerotic calcifications without aneurysm. No abdominopelvic lymphadenopathy. Reproductive: Mildly enlarged prostate gland. Other: No free fluid. No abdominopelvic fluid collection. No pneumoperitoneum. No abdominal wall hernia. Musculoskeletal: No new or acute bony findings. IMPRESSION: 1. New left perinephric stranding. No renal stone or hydronephrosis. Findings may represent a recently passed stone versus ascending urinary tract infection/pyelonephritis. Correlate with urinalysis. 2. Extensive colonic diverticulosis without evidence of acute diverticulitis. 3. Mild hepatosplenomegaly. 4. Aortic atherosclerosis (ICD10-I70.0). Electronically Signed   By: Davina Poke D.O.   On: 01/26/2023 10:58   DG Chest 2 View  Result Date: 01/26/2023 CLINICAL DATA:  Several day history of congestion, fever, dysuria, and emesis EXAM: CHEST - 2 VIEW COMPARISON:  Chest radiograph dated 12/29/2022 FINDINGS: Normal lung volumes. No focal consolidations. No pleural effusion or pneumothorax. The heart size and mediastinal contours are within normal limits. The visualized skeletal structures are unremarkable. IMPRESSION: No active cardiopulmonary disease. Electronically Signed   By: Darrin Nipper M.D.   On: 01/26/2023 10:30    Procedures Procedures    Medications Ordered in ED Medications  0.9 % NaCl with KCl 20 mEq/ L  infusion ( Intravenous New Bag/Given 01/26/23 1335)  sodium chloride 0.9 % bolus 1,000 mL (0 mLs Intravenous Stopped 01/26/23 1148)  acetaminophen (TYLENOL) tablet 1,000 mg (1,000 mg Oral Given 01/26/23 1057)   ondansetron (ZOFRAN) injection 4 mg (4 mg Intravenous Given 01/26/23 1055)  cefTRIAXone (ROCEPHIN) 1 g in sodium chloride 0.9 % 100 mL IVPB (0 g Intravenous Stopped 01/26/23 1220)  sodium chloride 0.9 % bolus 1,000 mL (1,000 mLs Intravenous New Bag/Given 01/26/23 1333)  magnesium sulfate IVPB 2 g 50 mL (2 g Intravenous New Bag/Given 01/26/23 1419)    ED Course/ Medical Decision Making/ A&P  Medical Decision Making Amount and/or Complexity of Data Reviewed Labs: ordered. Radiology: ordered.  Risk OTC drugs. Prescription drug management. Decision regarding hospitalization.   77 y.o. male presents to the ED with complaints of fevers, chills, cough, congestion, dysuria, this involves an extensive number of treatment options, and is a complaint that carries with it a high risk of complications and morbidity.  The differential diagnosis includes UTI, pyelonephritis, kidney stone or infected stone, pneumonia, viral URI  On arrival pt is nontoxic, vitals significant for fever of 100.6 on arrival, but fortunately without tachycardia and normotensive.   Additional history obtained from wife at bedside. Previous records obtained and reviewed   I ordered medication IV fluids, Tylenol and IV Rocephin for treatment of fever, dehydration and UTI  Lab Tests:  I Ordered, reviewed, and interpreted labs, which included: Leukocytosis of 21.3 with normal hemoglobin, creatinine and BUN slightly increased on top of baseline CKD with creatinine of 1.65, mild hypokalemia of 3.4 but no other electrolyte derangements and normal liver function.  Initial lactic acid elevated at 4.2, after IV fluids lactic acid improved to 1.4.  UA consistent with infection with moderate hemoglobin, positive for leukocytes and nitrates with bacteria noted on microscopic exam.  Imaging Studies ordered:  I ordered imaging studies which included chest x-ray and CT renal stone study, I independently  visualized and interpreted imaging which showed no evidence of pneumonia.  Left perinephric stranding concerning for pyelonephritis but fortunately no evidence of obstructing renal stone  ED Course:   Patient reports feeling quite poorly compared to how he is felt with prior UTIs, given initial lactic acidosis, leukocytosis and slight worsening of renal function as well as patient's age feel be most appropriate for admission with close monitoring and following cultures especially given patient's prior history of infections with both Klebsiella and Enterococcus.  Discussed with pharmacy and will start with IV Rocephin for treatment of pyelonephritis.  Discussed plan for admission with patient wife at bedside and they are in agreement.  Consult placed to hospitalist for admission.  Case discussed with Dr. Tennis Must who accepts patient for transfer and admission.   Portions of this note were generated with [employer]. Dictation errors may occur despite best attempts at proofreading.         Final Clinical Impression(s) / ED Diagnoses Final diagnoses:  Pyelonephritis    Rx / DC Orders ED Discharge Orders     None         Janet Berlin 01/26/23 1555    Fransico Meadow, MD 01/26/23 [phone]

## 2023-01-26 NOTE — ED Notes (Signed)
ED TO INPATIENT HANDOFF REPORT  ED Nurse Name and Phone #: Newman Pies [phone]  S Name/Age/Gender Brandon Burns 77 y.o. male Room/Bed: MH01/MH01  Code Status   Code Status: Not on file  Home/SNF/Other Home Patient oriented to: self, place, time, and situation Is this baseline? Yes   Triage Complete: Triage complete  Chief Complaint Acute pyelonephritis [N10]  Triage Note Symptoms started as congestion 4 days ago , dysuria and fever x 3 days , emesis x 2 days .  Right lower back he said . Hx kidney stones .    Allergies No Known Allergies  Level of Care/Admitting Diagnosis ED Disposition     ED Disposition  Admit   Condition  --   Comment  Hospital Area: [facility] [[id number]]  Level of Care: Med-Surg [16]  May admit patient to [facility] or [facility] if equivalent level of care is available:: No  Interfacility transfer: Yes  Covid Evaluation: Confirmed COVID Negative  Diagnosis: Acute pyelonephritis [id number]  Admitting Physician: Reubin Milan [id number]  Attending Physician: Reubin Milan [id number]  Certification:: I certify this patient will need inpatient services for at least 2 midnights  Estimated Length of Stay: 2          B Medical/Surgery History Past Medical History:  Diagnosis Date   Aortic atherosclerosis ([place]) 09/02/2021   B12 deficiency 02/17/2021   Benign prostatic hyperplasia with urinary frequency 04/20/2022   CKD (chronic kidney disease)    Gastroesophageal reflux disease 09/25/2019   Hypertension    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/25/2019   Kidney stone    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   Lumbar degenerative disc disease 04/18/2017   Mixed hyperlipidemia 09/25/2019   Stage 3 chronic kidney disease ([place]) 01/06/2018   Vitamin D deficiency 12/05/2020   Past Surgical History:  Procedure Laterality Date   BACK SURGERY     CHOLECYSTECTOMY     HEMORRHOID SURGERY     REPLACEMENT  TOTAL KNEE Left 12/10/2021     A IV Location/Drains/Wounds Patient Lines/Drains/Airways Status     Active Line/Drains/Airways     Name Placement date Placement time Site Days   Peripheral IV 01/26/23 20 G 1" Anterior;Right Forearm 01/26/23  1055  Forearm  less than 1            Intake/Output Last 24 hours  Intake/Output Summary (Last 24 hours) at 01/26/2023 1508 Last data filed at 01/26/2023 1148 Gross per 24 hour  Intake 1000 ml  Output --  Net 1000 ml    Labs/Imaging Results for orders placed or performed during the hospital encounter of 01/26/23 (from the past 48 hour(s))  Urinalysis, Routine w reflex microscopic -Urine, Clean Catch     Status: Abnormal   Collection Time: 01/26/23  9:34 AM  Result Value Ref Range   Color, Urine YELLOW YELLOW   APPearance CLOUDY (A) CLEAR   Specific Gravity, Urine 1.025 1.005 - 1.030   pH 5.5 5.0 - 8.0   Glucose, UA NEGATIVE NEGATIVE mg/dL   Hgb urine dipstick MODERATE (A) NEGATIVE   Bilirubin Urine NEGATIVE NEGATIVE   Ketones, ur NEGATIVE NEGATIVE mg/dL   Protein, ur 100 (A) NEGATIVE mg/dL   Nitrite POSITIVE (A) NEGATIVE   Leukocytes,Ua SMALL (A) NEGATIVE    Comment: Performed at [facility], [address] [place], [place] [address]  Resp panel by RT-PCR (RSV, Flu A&B, Covid) Urine, Clean Catch     Status: None   Collection  Time: 01/26/23  9:34 AM   Specimen: Urine, Clean Catch; Nasal Swab  Result Value Ref Range   SARS Coronavirus 2 by RT PCR NEGATIVE NEGATIVE    Comment: (NOTE) SARS-CoV-2 target nucleic acids are NOT DETECTED.  The SARS-CoV-2 RNA is generally detectable in upper respiratory specimens during the acute phase of infection. The lowest concentration of SARS-CoV-2 viral copies this assay can detect is 138 copies/mL. A negative result does not preclude SARS-Cov-2 infection and should not be used as the sole basis for treatment or other patient management decisions. A negative result may occur with   improper specimen collection/handling, submission of specimen other than nasopharyngeal swab, presence of viral mutation(s) within the areas targeted by this assay, and inadequate number of viral copies(<138 copies/mL). A negative result must be combined with clinical observations, patient history, and epidemiological information. The expected result is Negative.  Fact Sheet for Patients:  EntrepreneurPulse.com.au  Fact Sheet for Healthcare Providers:  IncredibleEmployment.be  This test is no t yet approved or cleared by the [place] FDA and  has been authorized for detection and/or diagnosis of SARS-CoV-2 by FDA under an Emergency Use Authorization (EUA). This EUA will remain  in effect (meaning this test can be used) for the duration of the COVID-19 declaration under Section 564(b)(1) of the Act, 21 U.S.C.section 360bbb-3(b)(1), unless the authorization is terminated  or revoked sooner.       Influenza A by PCR NEGATIVE NEGATIVE   Influenza B by PCR NEGATIVE NEGATIVE    Comment: (NOTE) The Xpert Xpress SARS-CoV-2/FLU/RSV plus assay is intended as an aid in the diagnosis of influenza from Nasopharyngeal swab specimens and should not be used as a sole basis for treatment. Nasal washings and aspirates are unacceptable for Xpert Xpress SARS-CoV-2/FLU/RSV testing.  Fact Sheet for Patients: EntrepreneurPulse.com.au  Fact Sheet for Healthcare Providers: IncredibleEmployment.be  This test is not yet approved or cleared by the [place] FDA and has been authorized for detection and/or diagnosis of SARS-CoV-2 by FDA under an Emergency Use Authorization (EUA). This EUA will remain in effect (meaning this test can be used) for the duration of the COVID-19 declaration under Section 564(b)(1) of the Act, 21 U.S.C. section 360bbb-3(b)(1), unless the authorization is terminated or revoked.     Resp  Syncytial Virus by PCR NEGATIVE NEGATIVE    Comment: (NOTE) Fact Sheet for Patients: EntrepreneurPulse.com.au  Fact Sheet for Healthcare Providers: IncredibleEmployment.be  This test is not yet approved or cleared by the [place] FDA and has been authorized for detection and/or diagnosis of SARS-CoV-2 by FDA under an Emergency Use Authorization (EUA). This EUA will remain in effect (meaning this test can be used) for the duration of the COVID-19 declaration under Section 564(b)(1) of the Act, 21 U.S.C. section 360bbb-3(b)(1), unless the authorization is terminated or revoked.  Performed at [facility], [address] [place], [place] [address]   Urinalysis, Microscopic (reflex)     Status: Abnormal   Collection Time: 01/26/23  9:34 AM  Result Value Ref Range   RBC / HPF 21-50 0 - 5 RBC/hpf   WBC, UA 6-10 0 - 5 WBC/hpf   Bacteria, UA FEW (A) NONE SEEN   Squamous Epithelial / HPF 0-5 0 - 5 /HPF    Comment: Performed at [facility], [address] [place], [place] [id number]  Basic metabolic panel     Status: Abnormal   Collection Time: 01/26/23  9:57 AM  Result Value Ref Range  Sodium 135 135 - 145 mmol/L   Potassium 3.4 (L) 3.5 - 5.1 mmol/L   Chloride 98 98 - 111 mmol/L   CO2 24 22 - 32 mmol/L   Glucose, Bld 156 (H) 70 - 99 mg/dL    Comment: Glucose reference range applies only to samples taken after fasting for at least 8 hours.   BUN 26 (H) 8 - 23 mg/dL   Creatinine, Ser 1.65 (H) 0.61 - 1.24 mg/dL   Calcium 9.7 8.9 - 10.3 mg/dL   GFR, Estimated 43 (L) >60 mL/min    Comment: (NOTE) Calculated using the CKD-EPI Creatinine Equation (2021)    Anion gap 13 5 - 15    Comment: Performed at [facility], [address] [place], [place] [address]  CBC     Status: Abnormal   Collection Time: 01/26/23  9:57 AM  Result Value Ref Range   WBC 21.3 (H) 4.0 - 10.5 K/uL   RBC  4.86 4.22 - 5.81 MIL/uL   Hemoglobin 14.4 13.0 - 17.0 g/dL   HCT 42.5 39.0 - 52.0 %   MCV 87.4 80.0 - 100.0 fL   MCH 29.6 26.0 - 34.0 pg   MCHC 33.9 30.0 - 36.0 g/dL   RDW 14.2 11.5 - 15.5 %   Platelets 215 150 - 400 K/uL   nRBC 0.0 0.0 - 0.2 %    Comment: Performed at [facility], [address] [place], [place] [address]  Lactic acid, plasma     Status: Abnormal   Collection Time: 01/26/23  9:57 AM  Result Value Ref Range   Lactic Acid, Venous 4.2 (HH) 0.5 - 1.9 mmol/L    Comment: CRITICAL RESULT CALLED TO, READ BACK BY AND VERIFIED WITH Lissa Morales, RN [phone] [facility] 01/26/23 Performed at [facility], [address] [place], [place] [address]   Hepatic function panel     Status: Abnormal   Collection Time: 01/26/23  9:57 AM  Result Value Ref Range   Total Protein 7.3 6.5 - 8.1 g/dL    Comment: Performed at [facility] at [facility], [address], [place], [place] [address]   Albumin 4.4 3.5 - 5.0 g/dL    Comment: Performed at [facility] at [facility], [address] [place], [place] [address]   AST 16 15 - 41 U/L    Comment: Performed at [facility] at [facility], [address], [place], [place] [address]   ALT 14 0 - 44 U/L    Comment: Performed at [facility] at [facility], [address], [place], [place] [address]   Alkaline Phosphatase 75 38 - 126 U/L    Comment: Performed at [facility] at [facility], [address] [place], [place] [address]   Total Bilirubin 1.1 0.3 - 1.2 mg/dL    Comment: Performed at [facility] at [facility], [address], [place], [place] [address]   Bilirubin, Direct 0.3 (H) 0.0 - 0.2 mg/dL   Indirect Bilirubin 0.8 0.3 - 0.9 mg/dL    Comment: Performed at [facility], [address] [place], [place] [address]  Magnesium     Status:  Abnormal   Collection Time: 01/26/23  9:57 AM  Result Value Ref Range  Magnesium 1.6 (L) 1.7 - 2.4 mg/dL    Comment: Performed at [facility], [address] [place], [place] [address]  Phosphorus     Status: Abnormal   Collection Time: 01/26/23  9:57 AM  Result Value Ref Range   Phosphorus 2.1 (L) 2.5 - 4.6 mg/dL    Comment: Performed at [facility], [address] [place], [place] [address]  Lactic acid, plasma     Status: None   Collection Time: 01/26/23  1:45 PM  Result Value Ref Range   Lactic Acid, Venous 1.4 0.5 - 1.9 mmol/L    Comment: Performed at [facility], [address] [place], [place] [address]   CT Renal Joaquim Lai Study  Result Date: 01/26/2023 CLINICAL DATA:  Flank pain, dysuria EXAM: CT ABDOMEN AND PELVIS WITHOUT CONTRAST TECHNIQUE: Multidetector CT imaging of the abdomen and pelvis was performed following the standard protocol without IV contrast. RADIATION DOSE REDUCTION: This exam was performed according to the departmental dose-optimization program which includes automated exposure control, adjustment of the mA and/or kV according to patient size and/or use of iterative reconstruction technique. COMPARISON:  05/01/2022 FINDINGS: Lower chest: Included lung bases are clear.  Heart size is normal. Hepatobiliary: No focal liver abnormality is seen on noncontrast imaging. Liver measures 18 cm in length. Status post cholecystectomy. No biliary dilatation. Pancreas: Unremarkable. No pancreatic ductal dilatation or surrounding inflammatory changes. Spleen: Spleen measures 14 cm in length. No focal splenic abnormality. Adrenals/Urinary Tract: Unremarkable adrenal glands. New left perinephric stranding. No renal stone or hydronephrosis. No ureteral or bladder stone. Unremarkable appearance of the right kidney. Urinary bladder within normal limits for the degree of distension. Stomach/Bowel: Stomach is within normal limits. Appendix appears normal.  Extensive colonic diverticulosis. No evidence of bowel wall thickening, distention, or inflammatory changes. Vascular/Lymphatic: Scattered aortoiliac atherosclerotic calcifications without aneurysm. No abdominopelvic lymphadenopathy. Reproductive: Mildly enlarged prostate gland. Other: No free fluid. No abdominopelvic fluid collection. No pneumoperitoneum. No abdominal wall hernia. Musculoskeletal: No new or acute bony findings. IMPRESSION: 1. New left perinephric stranding. No renal stone or hydronephrosis. Findings may represent a recently passed stone versus ascending urinary tract infection/pyelonephritis. Correlate with urinalysis. 2. Extensive colonic diverticulosis without evidence of acute diverticulitis. 3. Mild hepatosplenomegaly. 4. Aortic atherosclerosis (ICD10-I70.0). Electronically Signed   By: Davina Poke D.O.   On: 01/26/2023 10:58   DG Chest 2 View  Result Date: 01/26/2023 CLINICAL DATA:  Several day history of congestion, fever, dysuria, and emesis EXAM: CHEST - 2 VIEW COMPARISON:  Chest radiograph dated 12/29/2022 FINDINGS: Normal lung volumes. No focal consolidations. No pleural effusion or pneumothorax. The heart size and mediastinal contours are within normal limits. The visualized skeletal structures are unremarkable. IMPRESSION: No active cardiopulmonary disease. Electronically Signed   By: Darrin Nipper M.D.   On: 01/26/2023 10:30    Pending Labs Unresulted Labs (From admission, onward)     Start     Ordered   01/26/23 0949  Culture, blood (routine x 2)  BLOOD CULTURE X 2,   STAT      01/26/23 0948            Vitals/Pain Today's Vitals   01/26/23 1148 01/26/23 1200 01/26/23 1300 01/26/23 1400  BP:  (!) 138/58 125/75 (!) 144/69  Pulse:  80 90 84  Resp:  20 17 18  $ Temp:  100.1 F (37.8 C)  100 F (37.8 C)  TempSrc:  Oral    SpO2:  98% 98%  96%  Weight:      Height:      PainSc: 3        Isolation Precautions Airborne and Contact  precautions  Medications Medications  0.9 % NaCl with KCl 20 mEq/ L  infusion ( Intravenous New Bag/Given 01/26/23 1335)  magnesium sulfate IVPB 2 g 50 mL (2 g Intravenous New Bag/Given 01/26/23 1419)  sodium chloride 0.9 % bolus 1,000 mL (0 mLs Intravenous Stopped 01/26/23 1148)  acetaminophen (TYLENOL) tablet 1,000 mg (1,000 mg Oral Given 01/26/23 1057)  ondansetron (ZOFRAN) injection 4 mg (4 mg Intravenous Given 01/26/23 1055)  cefTRIAXone (ROCEPHIN) 1 g in sodium chloride 0.9 % 100 mL IVPB (0 g Intravenous Stopped 01/26/23 1220)  sodium chloride 0.9 % bolus 1,000 mL (1,000 mLs Intravenous New Bag/Given 01/26/23 1333)    Mobility walks     Focused Assessments Renal Assessment Handoff:  Pyelo (r) hx same   R Recommendations: See Admitting Provider Note  Report given to:   Additional Notes: Lactic decreased after fluids. A/O x 4; 20g IV R forearm

## 2023-01-27 DIAGNOSIS — N10 Acute tubulo-interstitial nephritis: Secondary | ICD-10-CM | POA: Diagnosis not present

## 2023-01-27 LAB — CBC
HCT: 38.5 % — ABNORMAL LOW (ref 39.0–52.0)
Hemoglobin: 12.7 g/dL — ABNORMAL LOW (ref 13.0–17.0)
MCH: 29.8 pg (ref 26.0–34.0)
MCHC: 33 g/dL (ref 30.0–36.0)
MCV: 90.4 fL (ref 80.0–100.0)
Platelets: 161 10*3/uL (ref 150–400)
RBC: 4.26 MIL/uL (ref 4.22–5.81)
RDW: 14.6 % (ref 11.5–15.5)
WBC: 16.5 10*3/uL — ABNORMAL HIGH (ref 4.0–10.5)
nRBC: 0 % (ref 0.0–0.2)

## 2023-01-27 LAB — BASIC METABOLIC PANEL
Anion gap: 9 (ref 5–15)
BUN: 22 mg/dL (ref 8–23)
CO2: 21 mmol/L — ABNORMAL LOW (ref 22–32)
Calcium: 8 mg/dL — ABNORMAL LOW (ref 8.9–10.3)
Chloride: 105 mmol/L (ref 98–111)
Creatinine, Ser: 1.05 mg/dL (ref 0.61–1.24)
GFR, Estimated: >60 mL/min (ref >60)
Glucose, Bld: 110 mg/dL — ABNORMAL HIGH (ref 70–99)
Potassium: 3.7 mmol/L (ref 3.5–5.1)
Sodium: 135 mmol/L (ref 135–145)

## 2023-01-27 LAB — MAGNESIUM: Magnesium: 1.8 mg/dL (ref 1.7–2.4)

## 2023-01-27 MED ORDER — HYDRALAZINE HCL 20 MG/ML IJ SOLN: 10.0000 mg | Freq: Four times a day (QID) | INTRAMUSCULAR | Status: DC | PRN

## 2023-01-27 MED ORDER — SODIUM CHLORIDE 0.9 % IV SOLN: INTRAVENOUS | Status: DC

## 2023-01-27 MED ORDER — DM-GUAIFENESIN ER 30-600 MG PO TB12
1.0000 | ORAL_TABLET | Freq: Two times a day (BID) | ORAL | Status: DC | PRN
  Administered 2023-01-27: 1 via ORAL
  Filled 2023-01-27: qty 1

## 2023-01-27 NOTE — Plan of Care (Signed)
Patient AOX4, VSS throughout shift.  All meds given on time as ordered.  Pt c/o headache relieved by PRN tylenol.  Diminished lungs, IS encouraged.  Pt voided in urinal.  Continuous fluids on-going.  Pt standby assist to bathroom.  POC maintained, will continue to monitor.  Problem: Education: Goal: Knowledge of General Education information will improve Description: Including pain rating scale, medication(s)/side effects and non-pharmacologic comfort measures Outcome: Progressing   Problem: Health Behavior/Discharge Planning: Goal: Ability to manage health-related needs will improve Outcome: Progressing   Problem: Clinical Measurements: Goal: Ability to maintain clinical measurements within normal limits will improve Outcome: Progressing Goal: Will remain free from infection Outcome: Progressing Goal: Diagnostic test results will improve Outcome: Progressing Goal: Respiratory complications will improve Outcome: Progressing Goal: Cardiovascular complication will be avoided Outcome: Progressing   Problem: Activity: Goal: Risk for activity intolerance will decrease Outcome: Progressing   Problem: Nutrition: Goal: Adequate nutrition will be maintained Outcome: Progressing   Problem: Coping: Goal: Level of anxiety will decrease Outcome: Progressing   Problem: Elimination: Goal: Will not experience complications related to bowel motility Outcome: Progressing Goal: Will not experience complications related to urinary retention Outcome: Progressing   Problem: Pain Managment: Goal: General experience of comfort will improve Outcome: Progressing   Problem: Safety: Goal: Ability to remain free from injury will improve Outcome: Progressing   Problem: Skin Integrity: Goal: Risk for impaired skin integrity will decrease Outcome: Progressing

## 2023-01-27 NOTE — TOC Progression Note (Signed)
Transition of Care [facility]) - Progression Note    Patient Details  Name: Brandon Burns MRN: [id number] Date of Birth: 10-20-1946  Transition of Care [facility]) CM/SW [place], RN Phone Number:[phone]  01/27/2023, 2:03 PM  Clinical Narrative:     Transition of Care [facility]) Screening Note   Patient Details  Name: Brandon Burns Date of Birth: 04-06-1946   Transition of Care [facility]) CM/SW Contact:    Angelita Ingles, RN Phone Number: 01/27/2023, 2:03 PM    Transition of Care Department [facility]) has reviewed patient and no TOC needs have been identified at this time. We will continue to monitor patient advancement through interdisciplinary progression rounds. If new patient transition needs arise, please place a TOC consult.           Expected Discharge Plan and Services                                               Social Determinants of Health (SDOH) Interventions SDOH Screenings   Food Insecurity: No Food Insecurity (01/26/2023)  Housing: Low Risk  (01/26/2023)  Transportation Needs: No Transportation Needs (01/26/2023)  Utilities: Not At Risk (01/26/2023)  Tobacco Use: Low Risk  (01/26/2023)    Readmission Risk Interventions     No data to display

## 2023-01-27 NOTE — Progress Notes (Signed)
PROGRESS NOTE    Brandon Burns [facility]  [id number] DOB: February 13, 1946 DOA: 01/26/2023 PCP: Jefm Petty, MD     Brief Narrative:  Brandon Burns is a 77 y.o. male with medical history significant of BPH, CKD stage IIIa, GERD, hypertension, nephrolithiasis, hyperlipidemia, history of Klebsiella and Enterococcus faecalis UTI in the past presented to ED with fever, back pain, nausea, vomiting, and urinary symptoms.  In the ED, patient noted to be febrile and slightly tachycardic.  Not hypotensive.  Labs significant for WBC 21.3, potassium 3.4, BUN 26, creatinine 1.6 (baseline 1.0-1.2), COVID/influenza/RSV PCR negative, lactic acid 4.2> 1.4 with IV fluids, magnesium 1.6, phosphorus 2.1.  UA with positive nitrite, small amount of leukocytes, and microscopy showing 21-50 RBCs, 6-10 WBCs, and few bacteria.  No urine culture collected.  Blood cultures collected.  Chest x-ray not suggestive of pneumonia.  CT renal stone study showing new left perinephric stranding.  No renal stone or hydronephrosis.  Findings may represent a recently passed stone versus ascending UTI/pyelonephritis. Patient received Tylenol, Zofran, ceftriaxone, IV magnesium 2 g, IV potassium, and 2 L IV fluids in the ED.   New events last 24 hours / Subjective: Feeling weak and tired, some dysuria   Assessment & Plan:   Principal Problem:   Acute pyelonephritis Active Problems:   Hypertension   GERD (gastroesophageal reflux disease)   Mixed hyperlipidemia   Stage 3 chronic kidney disease (HCC)   Hypokalemia   Hypomagnesemia   Hypophosphatemia   AKI (acute kidney injury) ([place])   Severe sepsis (HCC)   Severe sepsis secondary to acute pyelonephritis -Sepsis present on admission with fever and leukocytosis -Urine culture is pending -Blood cultures pending -Empiric Rocephin  AKI -Resolved with IV fluid    DVT prophylaxis:  enoxaparin (LOVENOX) injection 40 mg Start: 01/26/23 2200  Code Status: Full code Family  Communication: Sister in Sports coach at bedside Disposition Plan:  Status is: Inpatient Remains inpatient appropriate because: IV antibiotics    Antimicrobials:  Anti-infectives (From admission, onward)    Start     Dose/Rate Route Frequency Ordered Stop   01/27/23 1000  cefTRIAXone (ROCEPHIN) 1 g in sodium chloride 0.9 % 100 mL IVPB        1 g 200 mL/hr over 30 Minutes Intravenous Every 24 hours 01/26/23 1810     01/26/23 1130  cefTRIAXone (ROCEPHIN) 1 g in sodium chloride 0.9 % 100 mL IVPB        1 g 200 mL/hr over 30 Minutes Intravenous  Once 01/26/23 1127 01/26/23 1220        Objective: Vitals:   01/27/23 0152 01/27/23 0300 01/27/23 0438 01/27/23 0834  BP:   134/61 (!) 157/79  Pulse:   88 (!) 101  Resp:   16 18  Temp: (!) 100.4 F (38 C) 99.3 F (37.4 C) 99.5 F (37.5 C) 98.8 F (37.1 C)  TempSrc: Oral Axillary Oral Oral  SpO2:   94% 97%  Weight:      Height:        Intake/Output Summary (Last 24 hours) at 01/27/2023 1321 Last data filed at 01/27/2023 1234 Gross per 24 hour  Intake 1875.79 ml  Output 1125 ml  Net 750.79 ml   Filed Weights   01/26/23 0931 01/26/23 1147 01/26/23 1959  Weight: 98.4 kg 98.4 kg 96.9 kg    Examination:  General exam: Appears calm and comfortable  Respiratory system: Clear to auscultation. Respiratory effort normal. No respiratory distress. No conversational dyspnea.  Cardiovascular system: S1 & S2  heard, RRR. No murmurs. No pedal edema. Gastrointestinal system: Abdomen is nondistended, soft and nontender. Normal bowel sounds heard. Central nervous system: Alert and oriented. No focal neurological deficits. Speech clear.  Extremities: Symmetric in appearance  Skin: No rashes, lesions or ulcers on exposed skin  Psychiatry: Judgement and insight appear normal. Mood & affect appropriate.   Data Reviewed: I have personally reviewed following labs and imaging studies  CBC: Recent Labs  Lab 01/26/23 0957 01/27/23 0600  WBC 21.3* 16.5*   HGB 14.4 12.7*  HCT 42.5 38.5*  MCV 87.4 90.4  PLT 215 [id number]   Basic Metabolic Panel: Recent Labs  Lab 01/26/23 0957 01/27/23 0600  NA 135 135  K 3.4* 3.7  CL 98 105  CO2 24 21*  GLUCOSE 156* 110*  BUN 26* 22  CREATININE 1.65* 1.05  CALCIUM 9.7 8.0*  MG 1.6* 1.8  PHOS 2.1*  --    GFR: Estimated Creatinine Clearance: 74.5 mL/min (by C-G formula based on SCr of 1.05 mg/dL). Liver Function Tests: Recent Labs  Lab 01/26/23 0957  AST 16  ALT 14  ALKPHOS 75  BILITOT 1.1  PROT 7.3  ALBUMIN 4.4   No results for input(s): "LIPASE", "AMYLASE" in the last 168 hours. No results for input(s): "AMMONIA" in the last 168 hours. Coagulation Profile: No results for input(s): "INR", "PROTIME" in the last 168 hours. Cardiac Enzymes: No results for input(s): "CKTOTAL", "CKMB", "CKMBINDEX", "TROPONINI" in the last 168 hours. BNP (last 3 results) No results for input(s): "PROBNP" in the last 8760 hours. HbA1C: No results for input(s): "HGBA1C" in the last 72 hours. CBG: No results for input(s): "GLUCAP" in the last 168 hours. Lipid Profile: No results for input(s): "CHOL", "HDL", "LDLCALC", "TRIG", "CHOLHDL", "LDLDIRECT" in the last 72 hours. Thyroid Function Tests: No results for input(s): "TSH", "T4TOTAL", "FREET4", "T3FREE", "THYROIDAB" in the last 72 hours. Anemia Panel: No results for input(s): "VITAMINB12", "FOLATE", "FERRITIN", "TIBC", "IRON", "RETICCTPCT" in the last 72 hours. Sepsis Labs: Recent Labs  Lab 01/26/23 0957 01/26/23 1345  LATICACIDVEN 4.2* 1.4    Recent Results (from the past 240 hour(s))  Resp panel by RT-PCR (RSV, Flu A&B, Covid) Urine, Clean Catch     Status: None   Collection Time: 01/26/23  9:34 AM   Specimen: Urine, Clean Catch; Nasal Swab  Result Value Ref Range Status   SARS Coronavirus 2 by RT PCR NEGATIVE NEGATIVE Final    Comment: (NOTE) SARS-CoV-2 target nucleic acids are NOT DETECTED.  The SARS-CoV-2 RNA is generally detectable in upper  respiratory specimens during the acute phase of infection. The lowest concentration of SARS-CoV-2 viral copies this assay can detect is 138 copies/mL. A negative result does not preclude SARS-Cov-2 infection and should not be used as the sole basis for treatment or other patient management decisions. A negative result may occur with  improper specimen collection/handling, submission of specimen other than nasopharyngeal swab, presence of viral mutation(s) within the areas targeted by this assay, and inadequate number of viral copies(<138 copies/mL). A negative result must be combined with clinical observations, patient history, and epidemiological information. The expected result is Negative.  Fact Sheet for Patients:  EntrepreneurPulse.com.au  Fact Sheet for Healthcare Providers:  IncredibleEmployment.be  This test is no t yet approved or cleared by the [place] FDA and  has been authorized for detection and/or diagnosis of SARS-CoV-2 by FDA under an Emergency Use Authorization (EUA). This EUA will remain  in effect (meaning this test can be used) for the duration  of the COVID-19 declaration under Section 564(b)(1) of the Act, 21 U.S.C.section 360bbb-3(b)(1), unless the authorization is terminated  or revoked sooner.       Influenza A by PCR NEGATIVE NEGATIVE Final   Influenza B by PCR NEGATIVE NEGATIVE Final    Comment: (NOTE) The Xpert Xpress SARS-CoV-2/FLU/RSV plus assay is intended as an aid in the diagnosis of influenza from Nasopharyngeal swab specimens and should not be used as a sole basis for treatment. Nasal washings and aspirates are unacceptable for Xpert Xpress SARS-CoV-2/FLU/RSV testing.  Fact Sheet for Patients: EntrepreneurPulse.com.au  Fact Sheet for Healthcare Providers: IncredibleEmployment.be  This test is not yet approved or cleared by the [place] FDA and has been  authorized for detection and/or diagnosis of SARS-CoV-2 by FDA under an Emergency Use Authorization (EUA). This EUA will remain in effect (meaning this test can be used) for the duration of the COVID-19 declaration under Section 564(b)(1) of the Act, 21 U.S.C. section 360bbb-3(b)(1), unless the authorization is terminated or revoked.     Resp Syncytial Virus by PCR NEGATIVE NEGATIVE Final    Comment: (NOTE) Fact Sheet for Patients: EntrepreneurPulse.com.au  Fact Sheet for Healthcare Providers: IncredibleEmployment.be  This test is not yet approved or cleared by the [place] FDA and has been authorized for detection and/or diagnosis of SARS-CoV-2 by FDA under an Emergency Use Authorization (EUA). This EUA will remain in effect (meaning this test can be used) for the duration of the COVID-19 declaration under Section 564(b)(1) of the Act, 21 U.S.C. section 360bbb-3(b)(1), unless the authorization is terminated or revoked.  Performed at [facility], [address] [place], [place] [address]   Culture, blood (routine x 2)     Status: None (Preliminary result)   Collection Time: 01/26/23  9:57 AM   Specimen: BLOOD  Result Value Ref Range Status   Specimen Description   Final    BLOOD BLOOD RIGHT FOREARM Performed at [facility], [address] [place], [place] [address]    Special Requests   Final    Blood Culture adequate volume BOTTLES DRAWN AEROBIC AND ANAEROBIC Performed at [facility], [address] [place], [place] [address]    Culture   Final    NO GROWTH < 24 HOURS Performed at [facility], [place] [address], [place], [place] [address]    Report Status PENDING  Incomplete      Radiology Studies: CT Renal Stone Study  Result Date: 01/26/2023 CLINICAL DATA:  Flank pain, dysuria EXAM: CT ABDOMEN AND PELVIS WITHOUT CONTRAST TECHNIQUE: Multidetector CT imaging of the abdomen and pelvis  was performed following the standard protocol without IV contrast. RADIATION DOSE REDUCTION: This exam was performed according to the departmental dose-optimization program which includes automated exposure control, adjustment of the mA and/or kV according to patient size and/or use of iterative reconstruction technique. COMPARISON:  05/01/2022 FINDINGS: Lower chest: Included lung bases are clear.  Heart size is normal. Hepatobiliary: No focal liver abnormality is seen on noncontrast imaging. Liver measures 18 cm in length. Status post cholecystectomy. No biliary dilatation. Pancreas: Unremarkable. No pancreatic ductal dilatation or surrounding inflammatory changes. Spleen: Spleen measures 14 cm in length. No focal splenic abnormality. Adrenals/Urinary Tract: Unremarkable adrenal glands. New left perinephric stranding. No renal stone or hydronephrosis. No ureteral or bladder stone. Unremarkable appearance of the right kidney. Urinary bladder within normal limits for the degree of distension. Stomach/Bowel: Stomach is within normal limits. Appendix appears normal.  Extensive colonic diverticulosis. No evidence of bowel wall thickening, distention, or inflammatory changes. Vascular/Lymphatic: Scattered aortoiliac atherosclerotic calcifications without aneurysm. No abdominopelvic lymphadenopathy. Reproductive: Mildly enlarged prostate gland. Other: No free fluid. No abdominopelvic fluid collection. No pneumoperitoneum. No abdominal wall hernia. Musculoskeletal: No new or acute bony findings. IMPRESSION: 1. New left perinephric stranding. No renal stone or hydronephrosis. Findings may represent a recently passed stone versus ascending urinary tract infection/pyelonephritis. Correlate with urinalysis. 2. Extensive colonic diverticulosis without evidence of acute diverticulitis. 3. Mild hepatosplenomegaly. 4. Aortic atherosclerosis (ICD10-I70.0). Electronically Signed   By: Davina Poke D.O.   On: 01/26/2023 10:58    DG Chest 2 View  Result Date: 01/26/2023 CLINICAL DATA:  Several day history of congestion, fever, dysuria, and emesis EXAM: CHEST - 2 VIEW COMPARISON:  Chest radiograph dated 12/29/2022 FINDINGS: Normal lung volumes. No focal consolidations. No pleural effusion or pneumothorax. The heart size and mediastinal contours are within normal limits. The visualized skeletal structures are unremarkable. IMPRESSION: No active cardiopulmonary disease. Electronically Signed   By: Darrin Nipper M.D.   On: 01/26/2023 10:30      Scheduled Meds:  enoxaparin (LOVENOX) injection  40 mg Subcutaneous Q24H   Continuous Infusions:  sodium chloride 100 mL/hr at 01/27/23 [id number]   cefTRIAXone (ROCEPHIN)  IV 1 g (01/27/23 0928)     LOS: 1 day   Time spent: 25 minutes   Dessa Phi, DO [facility] 01/27/2023, 1:21 PM   Available via Epic secure chat 7am-7pm After these hours, please refer to coverage provider listed on [email or web address]

## 2023-01-28 DIAGNOSIS — N10 Acute tubulo-interstitial nephritis: Secondary | ICD-10-CM | POA: Diagnosis not present

## 2023-01-28 LAB — BASIC METABOLIC PANEL
Anion gap: 8 (ref 5–15)
BUN: 21 mg/dL (ref 8–23)
CO2: 24 mmol/L (ref 22–32)
Calcium: 8.2 mg/dL — ABNORMAL LOW (ref 8.9–10.3)
Chloride: 106 mmol/L (ref 98–111)
Creatinine, Ser: 1.27 mg/dL — ABNORMAL HIGH (ref 0.61–1.24)
GFR, Estimated: 59 mL/min — ABNORMAL LOW (ref >60)
Glucose, Bld: 99 mg/dL (ref 70–99)
Potassium: 4.4 mmol/L (ref 3.5–5.1)
Sodium: 138 mmol/L (ref 135–145)

## 2023-01-28 LAB — CBC
HCT: 38.2 % — ABNORMAL LOW (ref 39.0–52.0)
Hemoglobin: 12.5 g/dL — ABNORMAL LOW (ref 13.0–17.0)
MCH: 29.5 pg (ref 26.0–34.0)
MCHC: 32.7 g/dL (ref 30.0–36.0)
MCV: 90.1 fL (ref 80.0–100.0)
Platelets: 175 10*3/uL (ref 150–400)
RBC: 4.24 MIL/uL (ref 4.22–5.81)
RDW: 14.6 % (ref 11.5–15.5)
WBC: 10.5 10*3/uL (ref 4.0–10.5)
nRBC: 0 % (ref 0.0–0.2)

## 2023-01-28 MED ORDER — TIZANIDINE HCL 4 MG PO TABS
2.0000 mg | ORAL_TABLET | Freq: Once | ORAL | Status: AC
  Administered 2023-01-28: 2 mg via ORAL
  Filled 2023-01-28 (×2): qty 1

## 2023-01-28 MED ORDER — POLYETHYLENE GLYCOL 3350 17 G PO PACK
17.0000 g | PACK | Freq: Every day | ORAL | Status: DC
  Administered 2023-01-28: 17 g via ORAL
  Filled 2023-01-28: qty 1

## 2023-01-28 MED ORDER — CEFADROXIL 500 MG PO CAPS: 500.0000 mg | ORAL_CAPSULE | Freq: Two times a day (BID) | ORAL | 0 refills | Status: AC

## 2023-01-28 MED ORDER — DEXTROMETHORPHAN POLISTIREX ER 30 MG/5ML PO SUER: 15.0000 mg | Freq: Two times a day (BID) | ORAL | 0 refills | Status: DC | PRN

## 2023-01-28 MED ORDER — DEXTROMETHORPHAN POLISTIREX ER 30 MG/5ML PO SUER
15.0000 mg | Freq: Two times a day (BID) | ORAL | Status: DC | PRN
  Administered 2023-01-28: 15 mg via ORAL
  Filled 2023-01-28 (×2): qty 5

## 2023-01-28 MED ORDER — DOCUSATE SODIUM 100 MG PO CAPS: 100.0000 mg | ORAL_CAPSULE | Freq: Two times a day (BID) | ORAL | Status: DC | PRN

## 2023-01-28 MED ORDER — FLUTICASONE PROPIONATE 50 MCG/ACT NA SUSP: 1.0000 | Freq: Every day | NASAL | 0 refills | Status: DC

## 2023-01-28 NOTE — Plan of Care (Signed)

## 2023-01-28 NOTE — Plan of Care (Signed)

## 2023-01-28 NOTE — Discharge Summary (Signed)
Physician Discharge Summary  Brandon Burns [facility] [id number] DOB: 1945/12/12 DOA: 01/26/2023  PCP: Brandon Petty, MD  Admit date: 01/26/2023 Discharge date: 01/28/2023  Admitted From: Home Disposition:  Home   Recommendations for Outpatient Follow-up:  Follow up with PCP in 1 week  Discharge Condition: Stable CODE STATUS: Full  Diet recommendation: Regular   Brief/Interim Summary: Brandon Burns is a 77 y.o. male with medical history significant of BPH, CKD stage IIIa, GERD, hypertension, nephrolithiasis, hyperlipidemia, history of Klebsiella and Enterococcus faecalis UTI in the past presented to ED with fever, back pain, nausea, vomiting, and urinary symptoms.  In the ED, patient noted to be febrile and slightly tachycardic.  Not hypotensive.  Labs significant for WBC 21.3, potassium 3.4, BUN 26, creatinine 1.6 (baseline 1.0-1.2), COVID/influenza/RSV PCR negative, lactic acid 4.2> 1.4 with IV fluids, magnesium 1.6, phosphorus 2.1.  UA with positive nitrite, small amount of leukocytes, and microscopy showing 21-50 RBCs, 6-10 WBCs, and few bacteria.  No urine culture collected.  Blood cultures collected.  Chest x-ray not suggestive of pneumonia.  CT renal stone study showing new left perinephric stranding.  No renal stone or hydronephrosis.  Findings may represent a recently passed stone versus ascending UTI/pyelonephritis. Patient received Tylenol, Zofran, ceftriaxone, IV magnesium 2 g, IV potassium, and 2 L IV fluids in the ED.  AKI resolved with IV fluid.  Patient remained afebrile 24 hours prior to discharge home.  Leukocytosis resolved, lactic acidosis resolved.  Blood cultures were negative to date.  Urine culture was not available.  Patient was discharged home in improved and stable condition, on oral antibiotics and to follow-up with primary care physician.  Discharge Diagnoses:   Principal Problem:   Acute pyelonephritis Active Problems:   Hypertension   GERD (gastroesophageal reflux  disease)   Mixed hyperlipidemia   Stage 3 chronic kidney disease (HCC)   Hypokalemia   Hypomagnesemia   Hypophosphatemia   AKI (acute kidney injury) ([place])   Severe sepsis (HCC) Sepsis present on admission   Discharge Instructions  Discharge Instructions     Call MD for:  difficulty breathing, headache or visual disturbances   Complete by: As directed    Call MD for:  extreme fatigue   Complete by: As directed    Call MD for:  hives   Complete by: As directed    Call MD for:  persistant dizziness or light-headedness   Complete by: As directed    Call MD for:  persistant nausea and vomiting   Complete by: As directed    Call MD for:  severe uncontrolled pain   Complete by: As directed    Call MD for:  temperature >100.4   Complete by: As directed    Diet general   Complete by: As directed    Discharge instructions   Complete by: As directed    You were cared for by a hospitalist during your hospital stay. If you have any questions about your discharge medications or the care you received while you were in the hospital after you are discharged, you can call the unit and ask to speak with the hospitalist on call if the hospitalist that took care of you is not available. Once you are discharged, your primary care physician will handle any further medical issues. Please note that NO REFILLS for any discharge medications will be authorized once you are discharged, as it is imperative that you return to your primary care physician (or establish a relationship with a primary care physician if  you do not have one) for your aftercare needs so that they can reassess your need for medications and monitor your lab values.   Increase activity slowly   Complete by: As directed       Allergies as of 01/28/2023   No Known Allergies      Medication List     STOP taking these medications    benzonatate 100 MG capsule Commonly known as: TESSALON   methocarbamol 500 MG tablet Commonly  known as: ROBAXIN       TAKE these medications    Acetaminophen Extra Strength 500 MG Tabs Commonly known as: TYLENOL Take 1 tablet (500 mg total) by mouth every 6 (six) hours as needed. What changed:  how much to take reasons to take this   cefadroxil 500 MG capsule Commonly known as: DURICEF Take 1 capsule (500 mg total) by mouth 2 (two) times daily for 5 days.   dextromethorphan 30 MG/5ML liquid Commonly known as: DELSYM Take 2.5 mLs (15 mg total) by mouth 2 (two) times daily as needed for cough.   diclofenac Sodium 1 % Gel Commonly known as: Voltaren Apply 2 grams topically 4 (four) times daily as needed. What changed: reasons to take this   fluticasone 50 MCG/ACT nasal spray Commonly known as: FLONASE Place 1 spray into both nostrils daily.   omeprazole 20 MG tablet Commonly known as: PRILOSEC OTC Take 20 mg by mouth daily before breakfast.   tiZANidine 4 MG tablet Commonly known as: ZANAFLEX Take 4 mg by mouth at bedtime.   vitamin B-12 500 MCG tablet Commonly known as: CYANOCOBALAMIN Take 500-1,000 mcg by mouth daily.   Vitamin D3 50 MCG (2000 UT) Tabs Generic drug: Cholecalciferol Take 2,000 Units by mouth daily.   zolpidem 10 MG tablet Commonly known as: AMBIEN Take 10 mg by mouth at bedtime.        Follow-up Information     Brandon Petty, MD Follow up.   Specialty: Family Medicine Contact information: [id number] [place] [address] [phone]                No Known Allergies     Procedures/Studies: CT Renal Stone Study  Result Date: 01/26/2023 CLINICAL DATA:  Flank pain, dysuria EXAM: CT ABDOMEN AND PELVIS WITHOUT CONTRAST TECHNIQUE: Multidetector CT imaging of the abdomen and pelvis was performed following the standard protocol without IV contrast. RADIATION DOSE REDUCTION: This exam was performed according to the departmental dose-optimization program which includes automated exposure control,  adjustment of the mA and/or kV according to patient size and/or use of iterative reconstruction technique. COMPARISON:  05/01/2022 FINDINGS: Lower chest: Included lung bases are clear.  Heart size is normal. Hepatobiliary: No focal liver abnormality is seen on noncontrast imaging. Liver measures 18 cm in length. Status post cholecystectomy. No biliary dilatation. Pancreas: Unremarkable. No pancreatic ductal dilatation or surrounding inflammatory changes. Spleen: Spleen measures 14 cm in length. No focal splenic abnormality. Adrenals/Urinary Tract: Unremarkable adrenal glands. New left perinephric stranding. No renal stone or hydronephrosis. No ureteral or bladder stone. Unremarkable appearance of the right kidney. Urinary bladder within normal limits for the degree of distension. Stomach/Bowel: Stomach is within normal limits. Appendix appears normal. Extensive colonic diverticulosis. No evidence of bowel wall thickening, distention, or inflammatory changes. Vascular/Lymphatic: Scattered aortoiliac atherosclerotic calcifications without aneurysm. No abdominopelvic lymphadenopathy. Reproductive: Mildly enlarged prostate gland. Other: No free fluid. No abdominopelvic fluid collection. No pneumoperitoneum. No abdominal wall hernia. Musculoskeletal: No new or acute bony  findings. IMPRESSION: 1. New left perinephric stranding. No renal stone or hydronephrosis. Findings may represent a recently passed stone versus ascending urinary tract infection/pyelonephritis. Correlate with urinalysis. 2. Extensive colonic diverticulosis without evidence of acute diverticulitis. 3. Mild hepatosplenomegaly. 4. Aortic atherosclerosis (ICD10-I70.0). Electronically Signed   By: Davina Poke D.O.   On: 01/26/2023 10:58   DG Chest 2 View  Result Date: 01/26/2023 CLINICAL DATA:  Several day history of congestion, fever, dysuria, and emesis EXAM: CHEST - 2 VIEW COMPARISON:  Chest radiograph dated 12/29/2022 FINDINGS: Normal lung  volumes. No focal consolidations. No pleural effusion or pneumothorax. The heart size and mediastinal contours are within normal limits. The visualized skeletal structures are unremarkable. IMPRESSION: No active cardiopulmonary disease. Electronically Signed   By: Darrin Nipper M.D.   On: 01/26/2023 10:30       Discharge Exam: Vitals:   01/27/23 2145 01/28/23 0631  BP: (!) 142/67 128/64  Pulse: 93 76  Resp: 18 17  Temp: 98.7 F (37.1 C) 97.9 F (36.6 C)  SpO2: 100% 97%    General: Pt is alert, awake, not in acute distress Cardiovascular: RRR, S1/S2 +, no edema Respiratory: CTA bilaterally, no wheezing, no rhonchi, no respiratory distress, no conversational dyspnea  Abdominal: Soft, NT, ND, bowel sounds + Extremities: no edema, no cyanosis Psych: Normal mood and affect, stable judgement and insight     The results of significant diagnostics from this hospitalization (including imaging, microbiology, ancillary and laboratory) are listed below for reference.     Microbiology: Recent Results (from the past 240 hour(s))  Resp panel by RT-PCR (RSV, Flu A&B, Covid) Urine, Clean Catch     Status: None   Collection Time: 01/26/23  9:34 AM   Specimen: Urine, Clean Catch; Nasal Swab  Result Value Ref Range Status   SARS Coronavirus 2 by RT PCR NEGATIVE NEGATIVE Final    Comment: (NOTE) SARS-CoV-2 target nucleic acids are NOT DETECTED.  The SARS-CoV-2 RNA is generally detectable in upper respiratory specimens during the acute phase of infection. The lowest concentration of SARS-CoV-2 viral copies this assay can detect is 138 copies/mL. A negative result does not preclude SARS-Cov-2 infection and should not be used as the sole basis for treatment or other patient management decisions. A negative result may occur with  improper specimen collection/handling, submission of specimen other than nasopharyngeal swab, presence of viral mutation(s) within the areas targeted by this assay, and  inadequate number of viral copies(<138 copies/mL). A negative result must be combined with clinical observations, patient history, and epidemiological information. The expected result is Negative.  Fact Sheet for Patients:  EntrepreneurPulse.com.au  Fact Sheet for Healthcare Providers:  IncredibleEmployment.be  This test is no t yet approved or cleared by the [place] FDA and  has been authorized for detection and/or diagnosis of SARS-CoV-2 by FDA under an Emergency Use Authorization (EUA). This EUA will remain  in effect (meaning this test can be used) for the duration of the COVID-19 declaration under Section 564(b)(1) of the Act, 21 U.S.C.section 360bbb-3(b)(1), unless the authorization is terminated  or revoked sooner.       Influenza A by PCR NEGATIVE NEGATIVE Final   Influenza B by PCR NEGATIVE NEGATIVE Final    Comment: (NOTE) The Xpert Xpress SARS-CoV-2/FLU/RSV plus assay is intended as an aid in the diagnosis of influenza from Nasopharyngeal swab specimens and should not be used as a sole basis for treatment. Nasal washings and aspirates are unacceptable for Xpert Xpress SARS-CoV-2/FLU/RSV testing.  Fact Sheet  for Patients: EntrepreneurPulse.com.au  Fact Sheet for Healthcare Providers: IncredibleEmployment.be  This test is not yet approved or cleared by the [place] FDA and has been authorized for detection and/or diagnosis of SARS-CoV-2 by FDA under an Emergency Use Authorization (EUA). This EUA will remain in effect (meaning this test can be used) for the duration of the COVID-19 declaration under Section 564(b)(1) of the Act, 21 U.S.C. section 360bbb-3(b)(1), unless the authorization is terminated or revoked.     Resp Syncytial Virus by PCR NEGATIVE NEGATIVE Final    Comment: (NOTE) Fact Sheet for Patients: EntrepreneurPulse.com.au  Fact Sheet for Healthcare  Providers: IncredibleEmployment.be  This test is not yet approved or cleared by the [place] FDA and has been authorized for detection and/or diagnosis of SARS-CoV-2 by FDA under an Emergency Use Authorization (EUA). This EUA will remain in effect (meaning this test can be used) for the duration of the COVID-19 declaration under Section 564(b)(1) of the Act, 21 U.S.C. section 360bbb-3(b)(1), unless the authorization is terminated or revoked.  Performed at [facility], [address] [place], [place] [address]   Culture, blood (routine x 2)     Status: None (Preliminary result)   Collection Time: 01/26/23  9:57 AM   Specimen: BLOOD  Result Value Ref Range Status   Specimen Description   Final    BLOOD BLOOD RIGHT FOREARM Performed at [facility], [address] [place], [place] [address]    Special Requests   Final    Blood Culture adequate volume BOTTLES DRAWN AEROBIC AND ANAEROBIC Performed at [facility], [address] [place], [place] [address]    Culture   Final    NO GROWTH 2 DAYS Performed at [facility], [place] [address], [place], [place] [address]    Report Status PENDING  Incomplete     Labs: BNP (last 3 results) No results for input(s): "BNP" in the last 8760 hours. Basic Metabolic Panel: Recent Labs  Lab 01/26/23 0957 01/27/23 0600 01/28/23 0736  NA 135 135 138  K 3.4* 3.7 4.4  CL 98 105 106  CO2 24 21* 24  GLUCOSE 156* 110* 99  BUN 26* 22 21  CREATININE 1.65* 1.05 1.27*  CALCIUM 9.7 8.0* 8.2*  MG 1.6* 1.8  --   PHOS 2.1*  --   --    Liver Function Tests: Recent Labs  Lab 01/26/23 0957  AST 16  ALT 14  ALKPHOS 75  BILITOT 1.1  PROT 7.3  ALBUMIN 4.4   No results for input(s): "LIPASE", "AMYLASE" in the last 168 hours. No results for input(s): "AMMONIA" in the last 168 hours. CBC: Recent Labs  Lab 01/26/23 0957 01/27/23 0600 01/28/23 0736  WBC 21.3* 16.5* 10.5  HGB  14.4 12.7* 12.5*  HCT 42.5 38.5* 38.2*  MCV 87.4 90.4 90.1  PLT 215 161 175   Cardiac Enzymes: No results for input(s): "CKTOTAL", "CKMB", "CKMBINDEX", "TROPONINI" in the last 168 hours. BNP: Invalid input(s): "POCBNP" CBG: No results for input(s): "GLUCAP" in the last 168 hours. D-Dimer No results for input(s): "DDIMER" in the last 72 hours. Hgb A1c No results for input(s): "HGBA1C" in the last 72 hours. Lipid Profile No results for input(s): "CHOL", "HDL", "LDLCALC", "TRIG", "CHOLHDL", "LDLDIRECT" in the last 72 hours. Thyroid function studies No results for input(s): "TSH", "T4TOTAL", "T3FREE", "THYROIDAB" in the last 72 hours.  Invalid input(s): "FREET3" Anemia work up No results for input(s): "VITAMINB12", "FOLATE", "FERRITIN", "  TIBC", "IRON", "RETICCTPCT" in the last 72 hours. Urinalysis    Component Value Date/Time   COLORURINE YELLOW 01/26/2023 0934   APPEARANCEUR CLOUDY (A) 01/26/2023 0934   LABSPEC 1.025 01/26/2023 0934   PHURINE 5.5 01/26/2023 0934   GLUCOSEU NEGATIVE 01/26/2023 0934   HGBUR MODERATE (A) 01/26/2023 0934   BILIRUBINUR NEGATIVE 01/26/2023 0934   KETONESUR NEGATIVE 01/26/2023 0934   PROTEINUR 100 (A) 01/26/2023 0934   UROBILINOGEN 0.2 06/24/2012 1535   NITRITE POSITIVE (A) 01/26/2023 0934   LEUKOCYTESUR SMALL (A) 01/26/2023 0934   Sepsis Labs Recent Labs  Lab 01/26/23 0957 01/27/23 0600 01/28/23 0736  WBC 21.3* 16.5* 10.5   Microbiology Recent Results (from the past 240 hour(s))  Resp panel by RT-PCR (RSV, Flu A&B, Covid) Urine, Clean Catch     Status: None   Collection Time: 01/26/23  9:34 AM   Specimen: Urine, Clean Catch; Nasal Swab  Result Value Ref Range Status   SARS Coronavirus 2 by RT PCR NEGATIVE NEGATIVE Final    Comment: (NOTE) SARS-CoV-2 target nucleic acids are NOT DETECTED.  The SARS-CoV-2 RNA is generally detectable in upper respiratory specimens during the acute phase of infection. The lowest concentration of  SARS-CoV-2 viral copies this assay can detect is 138 copies/mL. A negative result does not preclude SARS-Cov-2 infection and should not be used as the sole basis for treatment or other patient management decisions. A negative result may occur with  improper specimen collection/handling, submission of specimen other than nasopharyngeal swab, presence of viral mutation(s) within the areas targeted by this assay, and inadequate number of viral copies(<138 copies/mL). A negative result must be combined with clinical observations, patient history, and epidemiological information. The expected result is Negative.  Fact Sheet for Patients:  EntrepreneurPulse.com.au  Fact Sheet for Healthcare Providers:  IncredibleEmployment.be  This test is no t yet approved or cleared by the [place] FDA and  has been authorized for detection and/or diagnosis of SARS-CoV-2 by FDA under an Emergency Use Authorization (EUA). This EUA will remain  in effect (meaning this test can be used) for the duration of the COVID-19 declaration under Section 564(b)(1) of the Act, 21 U.S.C.section 360bbb-3(b)(1), unless the authorization is terminated  or revoked sooner.       Influenza A by PCR NEGATIVE NEGATIVE Final   Influenza B by PCR NEGATIVE NEGATIVE Final    Comment: (NOTE) The Xpert Xpress SARS-CoV-2/FLU/RSV plus assay is intended as an aid in the diagnosis of influenza from Nasopharyngeal swab specimens and should not be used as a sole basis for treatment. Nasal washings and aspirates are unacceptable for Xpert Xpress SARS-CoV-2/FLU/RSV testing.  Fact Sheet for Patients: EntrepreneurPulse.com.au  Fact Sheet for Healthcare Providers: IncredibleEmployment.be  This test is not yet approved or cleared by the [place] FDA and has been authorized for detection and/or diagnosis of SARS-CoV-2 by FDA under an Emergency Use  Authorization (EUA). This EUA will remain in effect (meaning this test can be used) for the duration of the COVID-19 declaration under Section 564(b)(1) of the Act, 21 U.S.C. section 360bbb-3(b)(1), unless the authorization is terminated or revoked.     Resp Syncytial Virus by PCR NEGATIVE NEGATIVE Final    Comment: (NOTE) Fact Sheet for Patients: EntrepreneurPulse.com.au  Fact Sheet for Healthcare Providers: IncredibleEmployment.be  This test is not yet approved or cleared by the [place] FDA and has been authorized for detection and/or diagnosis of SARS-CoV-2 by FDA under an Emergency Use Authorization (EUA). This EUA will remain in effect (meaning  this test can be used) for the duration of the COVID-19 declaration under Section 564(b)(1) of the Act, 21 U.S.C. section 360bbb-3(b)(1), unless the authorization is terminated or revoked.  Performed at [facility], [address] [place], [place] [address]   Culture, blood (routine x 2)     Status: None (Preliminary result)   Collection Time: 01/26/23  9:57 AM   Specimen: BLOOD  Result Value Ref Range Status   Specimen Description   Final    BLOOD BLOOD RIGHT FOREARM Performed at [facility], [address] [place], [place] [address]    Special Requests   Final    Blood Culture adequate volume BOTTLES DRAWN AEROBIC AND ANAEROBIC Performed at [facility], [address] [place], [place] [address]    Culture   Final    NO GROWTH 2 DAYS Performed at [facility], [place] [address] [place], [place] [address]    Report Status PENDING  Incomplete     Patient was seen and examined on the day of discharge and was found to be in stable condition. Time coordinating discharge: 25 minutes including assessment and coordination of care, as well as examination of the patient.   SIGNED:  Dessa Phi, DO [facility] 01/28/2023, 11:56 AM

## 2023-01-28 NOTE — Evaluation (Signed)
Physical Therapy Evaluation Patient Details Name: Brandon Burns MRN: [id number] DOB: 08/06/1946 Today's Date: 01/28/2023  History of Present Illness  Pt admitted from home and dx with sepsis 2* pyelonephritis.  Pt with hx of Bil TKR, DDD, CKD, and back surgery  Clinical Impression  Pt admitted as above and presenting with ability to perform basic mobility tasks unassisted including ambulation in hall including back stepping, side stepping and 360 turns with good safety awareness and no LOB.  Pt with no PT needs identified at this time and eager for dc home.     Recommendations for follow up therapy are one component of a multi-disciplinary discharge planning process, led by the attending physician.  Recommendations may be updated based on patient status, additional functional criteria and insurance authorization.  Follow Up Recommendations No PT follow up      Assistance Recommended at Discharge PRN  Patient can return home with the following       Equipment Recommendations None recommended by PT  Recommendations for Other Services       Functional Status Assessment Patient has not had a recent decline in their functional status     Precautions / Restrictions Precautions Precautions: Fall Restrictions Weight Bearing Restrictions: No      Mobility  Bed Mobility Overal bed mobility: Modified Independent             General bed mobility comments: No physical assist    Transfers Overall transfer level: Needs assistance Equipment used: None Transfers: Sit to/from Stand Sit to Stand: Supervision           General transfer comment: for safety only - first time up in 3 days    Ambulation/Gait Ambulation/Gait assistance: Min guard, Supervision Gait Distance (Feet): 400 Feet Assistive device: None Gait Pattern/deviations: Step-through pattern, Decreased step length - right, Decreased step length - left, Shuffle, Wide base of support       General Gait  Details: mild general instability improving with increased distance ambulated.  No overt LOB  Stairs            Wheelchair Mobility    Modified Rankin (Stroke Patients Only)       Balance Overall balance assessment: Mild deficits observed, not formally tested                                           Pertinent Vitals/Pain Pain Assessment Pain Assessment: No/denies pain    Home Living Family/patient expects to be discharged to:: Private residence Living Arrangements: Spouse/significant other Available Help at Discharge: Family Type of Home: House Home Access: Stairs to enter Entrance Stairs-Rails: Right Entrance Stairs-Number of Steps: 3   Home Layout: One level Home Equipment: Conservation officer, nature (2 wheels);Cane - single point      Prior Function Prior Level of Function : Independent/Modified Independent             Mobility Comments: golfing several times a week       Hand Dominance        Extremity/Trunk Assessment   Upper Extremity Assessment Upper Extremity Assessment: Overall WFL for tasks assessed    Lower Extremity Assessment Lower Extremity Assessment: Overall WFL for tasks assessed    Cervical / Trunk Assessment Cervical / Trunk Assessment: Normal  Communication   Communication: No difficulties  Cognition Arousal/Alertness: Awake/alert Behavior During Therapy: WFL for tasks assessed/performed Overall Cognitive Status: Within  Functional Limits for tasks assessed                                          General Comments      Exercises     Assessment/Plan    PT Assessment Patient needs continued PT services;Patient does not need any further PT services  PT Problem List         PT Treatment Interventions Gait training    PT Goals (Current goals can be found in the Care Plan section)  Acute Rehab PT Goals Patient Stated Goal: HOME PT Goal Formulation: All assessment and education complete, DC  therapy    Frequency Min 1X/week     Co-evaluation               AM-PAC PT "6 Clicks" Mobility  Outcome Measure Help needed turning from your back to your side while in a flat bed without using bedrails?: None Help needed moving from lying on your back to sitting on the side of a flat bed without using bedrails?: None Help needed moving to and from a bed to a chair (including a wheelchair)?: None Help needed standing up from a chair using your arms (e.g., wheelchair or bedside chair)?: None Help needed to walk in hospital room?: A Little Help needed climbing 3-5 steps with a railing? : A Little 6 Click Score: 22    End of Session Equipment Utilized During Treatment: Gait belt Activity Tolerance: Patient tolerated treatment well Patient left: in chair;with call bell/phone within reach;with family/visitor present Nurse Communication: Mobility status      Time: [id number] PT Time Calculation (min) (ACUTE ONLY): 15 min   Charges:   PT Evaluation $PT Eval Low Complexity: 1 Low          [place] Pager [phone] Office [phone]   Zaydon Kinser 01/28/2023, 1:26 PM

## 2023-01-31 LAB — CULTURE, BLOOD (ROUTINE X 2)
Culture: NO GROWTH
Special Requests: ADEQUATE

## 2023-02-02 ENCOUNTER — Emergency Department (HOSPITAL_BASED_OUTPATIENT_CLINIC_OR_DEPARTMENT_OTHER)
Admission: EM | Admit: 2023-02-02 | Discharge: 2023-02-02 | Disposition: A | Discharge status: Home / Self Care | Payer: PPO | Attending: Emergency Medicine | Admitting: Emergency Medicine

## 2023-02-02 ENCOUNTER — Other Ambulatory Visit: Payer: Self-pay

## 2023-02-02 ENCOUNTER — Encounter (HOSPITAL_BASED_OUTPATIENT_CLINIC_OR_DEPARTMENT_OTHER): Payer: Self-pay | Admitting: Emergency Medicine

## 2023-02-02 DIAGNOSIS — R350 Frequency of micturition: Secondary | ICD-10-CM | POA: Insufficient documentation

## 2023-02-02 LAB — URINALYSIS, ROUTINE W REFLEX MICROSCOPIC
Bilirubin Urine: NEGATIVE
Glucose, UA: NEGATIVE mg/dL
Ketones, ur: NEGATIVE mg/dL
Leukocytes,Ua: NEGATIVE
Nitrite: NEGATIVE
Protein, ur: NEGATIVE mg/dL
Specific Gravity, Urine: 1.02 (ref 1.005–1.030)
pH: 6 (ref 5.0–8.0)

## 2023-02-02 LAB — URINALYSIS, MICROSCOPIC (REFLEX)
RBC / HPF: NONE SEEN RBC/hpf (ref 0–5)
Squamous Epithelial / HPF: NONE SEEN /HPF (ref 0–5)

## 2023-02-02 MED ORDER — FOSFOMYCIN TROMETHAMINE 3 G PO PACK
3.0000 g | PACK | Freq: Once | ORAL | Status: AC
  Administered 2023-02-02: 3 g via ORAL
  Filled 2023-02-02: qty 3

## 2023-02-02 NOTE — ED Triage Notes (Signed)
Was admitted for pyelonephritis and sepsis last week , persistent urinary frequency and left flank pain , also has Hx kidney stone

## 2023-02-02 NOTE — ED Provider Notes (Signed)
[facility] Provider Note   CSN: [id number] Arrival date & time: 02/02/23  [id number]     History  Chief Complaint  Patient presents with   Flank Pain    left    Brandon Burns is a 77 y.o. male.  77 yo M with a chief complaint of increased urinary frequency.  He tells me that this happens right before he gets a urinary tract infection.  He unfortunate was just in the hospital for sepsis due to pyelonephritis.  He was started on Rocephin and then transition to oral antibiotics.  He finished his last oral antibiotic dose yesterday.  His symptoms had improved but he started having urinary frequency yesterday and decided to come to the emergency department to have his urine retested.  He denies fevers denies flank pain.   Flank Pain       Home Medications Prior to Admission medications   Medication Sig Start Date End Date Taking? Authorizing Provider  acetaminophen (TYLENOL) 500 MG tablet Take 1 tablet (500 mg total) by mouth every 6 (six) hours as needed. Patient taking differently: Take 500-1,000 mg by mouth every 6 (six) hours as needed for mild pain or headache. 12/29/22   Domenic Moras, PA-C  cefadroxil (DURICEF) 500 MG capsule Take 1 capsule (500 mg total) by mouth 2 (two) times daily for 5 days. 01/28/23 02/02/23  Dessa Phi, DO  Cholecalciferol (VITAMIN D3) 50 MCG (2000 UT) TABS Take 2,000 Units by mouth daily.    [provider]  dextromethorphan (DELSYM) 30 MG/5ML liquid Take 2.5 mLs (15 mg total) by mouth 2 (two) times daily as needed for cough. 01/28/23   Dessa Phi, DO  diclofenac Sodium (VOLTAREN) 1 % GEL Apply 2 grams topically 4 (four) times daily as needed. Patient taking differently: Apply 2 g topically 4 (four) times daily as needed (to affected sites- for pain). 08/30/21   Long, Wonda Olds, MD  fluticasone (FLONASE) 50 MCG/ACT nasal spray Place 1 spray into both nostrils daily. 01/28/23 02/27/23  Dessa Phi, DO   omeprazole (PRILOSEC OTC) 20 MG tablet Take 20 mg by mouth daily before breakfast.    [provider]  tiZANidine (ZANAFLEX) 4 MG tablet Take 4 mg by mouth at bedtime.    [provider]  vitamin B-12 (CYANOCOBALAMIN) 500 MCG tablet Take 500-1,000 mcg by mouth daily.    [provider]  zolpidem (AMBIEN) 10 MG tablet Take 10 mg by mouth at bedtime.    [provider]      Allergies    Patient has no known allergies.    Review of Systems   Review of Systems  Genitourinary:  Positive for flank pain.    Physical Exam Updated Vital Signs BP 134/69   Pulse 68   Temp 98.1 F (36.7 C)   Resp 17   Wt 96.9 kg   SpO2 98%   BMI 28.18 kg/m  Physical Exam Vitals and nursing note reviewed.  Constitutional:      Appearance: He is well-developed.  HENT:     Head: Normocephalic and atraumatic.  Eyes:     Pupils: Pupils are equal, round, and reactive to light.  Neck:     Vascular: No JVD.  Cardiovascular:     Rate and Rhythm: Normal rate and regular rhythm.     Heart sounds: No murmur heard.    No friction rub. No gallop.  Pulmonary:     Effort: No respiratory distress.  Breath sounds: No wheezing.  Abdominal:     General: There is no distension.     Tenderness: There is no abdominal tenderness. There is no guarding or rebound.     Comments: No CVA tenderness on percussion.  Musculoskeletal:        General: Normal range of motion.     Cervical back: Normal range of motion and neck supple.  Skin:    Coloration: Skin is not pale.     Findings: No rash.  Neurological:     Mental Status: He is alert and oriented to person, place, and time.  Psychiatric:        Behavior: Behavior normal.     ED Results / Procedures / Treatments   Labs (all labs ordered are listed, but only abnormal results are displayed) Labs Reviewed  URINALYSIS, ROUTINE W REFLEX MICROSCOPIC - Abnormal; Notable for the following components:      Result Value   Hgb  urine dipstick TRACE (*)    All other components within normal limits  URINALYSIS, MICROSCOPIC (REFLEX) - Abnormal; Notable for the following components:   Bacteria, UA RARE (*)    All other components within normal limits  URINE CULTURE  LACTIC ACID, PLASMA  LACTIC ACID, PLASMA  COMPREHENSIVE METABOLIC PANEL  CBC WITH DIFFERENTIAL/PLATELET    EKG None  Radiology No results found.  Procedures Procedures    Medications Ordered in ED Medications  fosfomycin (MONUROL) packet 3 g (3 g Oral Given 02/02/23 1152)    ED Course/ Medical Decision Making/ A&P                             Medical Decision Making Amount and/or Complexity of Data Reviewed Labs: ordered.  Risk Prescription drug management.   77 yo M with a chief complaint of increased urinary frequency.  Patient is complicated and has had multiple urinary tract infections and subsequent pyelonephritis.  My review was also had multiple resistant organisms as well.  He was just treated for pyelonephritis with Rocephin and then transition to a cephalosporin.  His symptoms had improved and then he had recurrence of increased frequency.  His UA here is not obviously infected.  I will send it for urine culture.  Will give a one-time dose of fosfomycin.  Have him follow-up with his family doctor in the office.  12:16 PM:  I have discussed the diagnosis/risks/treatment options with the patient.  Evaluation and diagnostic testing in the emergency department does not suggest an emergent condition requiring admission or immediate intervention beyond what has been performed at this time.  They will follow up with PCP, urology. We also discussed returning to the ED immediately if new or worsening sx occur. We discussed the sx which are most concerning (e.g., sudden worsening pain, fever, inability to tolerate by mouth) that necessitate immediate return. Medications administered to the patient during their visit and any new prescriptions  provided to the patient are listed below.  Medications given during this visit Medications  fosfomycin (MONUROL) packet 3 g (3 g Oral Given 02/02/23 1152)     The patient appears reasonably screen and/or stabilized for discharge and I doubt any other medical condition or other [facility] requiring further screening, evaluation, or treatment in the ED at this time prior to discharge.          Final Clinical Impression(s) / ED Diagnoses Final diagnoses:  Urinary frequency    Rx / DC Orders ED  Discharge Orders     None         Deno Etienne, [place] 02/02/23 1216

## 2023-02-02 NOTE — Discharge Instructions (Addendum)
Please return for worsening symptoms fever

## 2023-02-02 NOTE — ED Notes (Signed)
Spoke with the pt and EDP, he only wants his urine evaluated. Did not want blood drawn.

## 2023-02-02 NOTE — ED Notes (Signed)
Discharge paperwork reviewed entirely with patient, including Rx's and follow up care. Pain was under control. Pt verbalized understanding as well as all parties involved. No questions or concerns voiced at the time of discharge. No acute distress noted.   Pt ambulated out to PVA without incident or assistance.

## 2023-02-04 LAB — URINE CULTURE: Culture: NO GROWTH

## 2023-02-08 ENCOUNTER — Encounter: Payer: Self-pay | Admitting: Urology

## 2023-02-08 ENCOUNTER — Ambulatory Visit (INDEPENDENT_AMBULATORY_CARE_PROVIDER_SITE_OTHER): Payer: PPO | Admitting: Urology

## 2023-02-08 VITALS — BP 141/83 | HR 76 | Ht 73.0 in | Wt 206.0 lb

## 2023-02-08 DIAGNOSIS — N401 Enlarged prostate with lower urinary tract symptoms: Secondary | ICD-10-CM

## 2023-02-08 DIAGNOSIS — N529 Male erectile dysfunction, unspecified: Secondary | ICD-10-CM | POA: Diagnosis not present

## 2023-02-08 DIAGNOSIS — R300 Dysuria: Secondary | ICD-10-CM

## 2023-02-08 DIAGNOSIS — N390 Urinary tract infection, site not specified: Secondary | ICD-10-CM

## 2023-02-08 LAB — URINALYSIS
Bilirubin, UA: NEGATIVE
Glucose, UA: NEGATIVE mg/dL
Ketones, POC UA: NEGATIVE mg/dL
Leukocytes, UA: NEGATIVE
Nitrite, UA: NEGATIVE
Protein Ur, POC: NEGATIVE mg/dL
Spec Grav, UA: 1.025 (ref 1.010–1.025)
Urobilinogen, UA: 0.2 E.U./dL
pH, UA: 6 (ref 5.0–8.0)

## 2023-02-08 LAB — BLADDER SCAN AMB NON-IMAGING

## 2023-02-08 NOTE — Progress Notes (Signed)
Assessment: 1. Recurrent UTI   2. Benign localized prostatic hyperplasia with lower urinary tract symptoms (LUTS)   3. Erectile dysfunction of organic origin      Plan: Today I had a long and detailed discussion with the patient and his girlfriend regarding his multiple urologic issues.  At this time he is not interested in pursuing further therapy for his ED.  He would likely be a candidate for penile injection therapy if he so chose.  Patient indicated to me that he has a prescription from his PCP for tamsulosin but has not started it. Patient will start taking tamsulosin. Prescription was provided by PCP.  Today I reviewed the rationale as well as nature of the medication including proper utilization and potential adverse events and side effects.  Patient will return in 6 to 8 weeks for reassessment with IPSS and for cystoscopy.  Total time providing care for visit today 41 min  Chief Complaint: recurrent uti  History of Present Illness:  Brandon Burns is a 76 y.o. male who is seen in consultation from Jefm Petty, MD for evaluation of complicated UTI. 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 Butch Penny in [facility].  Patient is status post spermatocelectomy and had a cystoscopy performed at that time as well in 2019 This year the patient has had 2 documented febrile urinary tract infections May 2023 at which time he grew Klebsiella and the other 01/2023.  Blood cultures were negative.  The most recent admission did not include a urine culture.  He had imaging findings with CT stone study showing left renal stranding but no evidence of stone mass or obstruction.   PSA testing over the last few years has ranged between 0.7 and 1.0.  Patient does report progressive lower urinary tract symptoms over a number of years.  Current IPSS =  20/3. 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  Social History:  Social History   Tobacco Use   Smoking status: Never   Smokeless tobacco: Never  Vaping Use   Vaping Use: Never used  Substance Use Topics   Alcohol use: No   Drug use: No    Review of symptoms:  Constitutional:  Negative for unexplained weight loss, night sweats, fever, chills ENT:  Negative for nose bleeds, sinus pain, painful swallowing CV:  Negative for chest pain, shortness of breath, exercise intolerance, palpitations, loss of consciousness Resp:  Negative for cough, wheezing, shortness of breath GI:  Negative for nausea, vomiting, diarrhea, bloody stools GU:  Positives noted in HPI; otherwise negative for gross hematuria, dysuria, urinary incontinence Neuro:  Negative for seizures, poor balance, limb weakness, slurred speech Psych:  Negative for lack of energy, depression, anxiety Endocrine:  Negative for polydipsia, polyuria, symptoms of hypoglycemia (dizziness, hunger, sweating) Hematologic:  Negative for anemia, purpura, petechia, prolonged or excessive bleeding, use of anticoagulants  Allergic:  Negative for  difficulty breathing or choking as a result of exposure to anything; no shellfish allergy; no allergic response (rash/itch) to materials, foods  Physical exam: BP (!) 141/83   Pulse 76   Ht '6\' 1"'$  (1.854 m)   Wt 206 lb (93.4 kg)   BMI 27.18 kg/m  GENERAL APPEARANCE:  Well appearing, well developed, well nourished, NAD  GU: Normal uncircumcised phallus.  Testes and cords are normal except for some postoperative changes from prior spermatocelectomy.  No evidence of hernia  DRE: Normal sphincter tone; prostate is approximately 40 to 50 g without evidence of nodules or induration.  Results: Results for orders placed or performed in visit on 02/08/23 (from the past 24 hour(s))  BLADDER SCAN AMB NON-IMAGING   Collection Time: 02/08/23 12:00 AM  Result Value Ref Range   Scan Result 59m

## 2023-04-06 ENCOUNTER — Other Ambulatory Visit: Payer: PPO | Admitting: Urology

## 2023-04-11 ENCOUNTER — Other Ambulatory Visit: Payer: PPO | Admitting: Urology

## 2023-05-24 ENCOUNTER — Ambulatory Visit: Payer: PPO | Attending: Internal Medicine | Admitting: Internal Medicine

## 2023-05-24 ENCOUNTER — Encounter: Payer: Self-pay | Admitting: Internal Medicine

## 2023-05-24 VITALS — BP 126/64 | HR 70 | Ht 73.0 in | Wt 216.6 lb

## 2023-05-24 DIAGNOSIS — E782 Mixed hyperlipidemia: Secondary | ICD-10-CM | POA: Diagnosis not present

## 2023-05-24 DIAGNOSIS — G4730 Sleep apnea, unspecified: Secondary | ICD-10-CM

## 2023-05-24 DIAGNOSIS — R0609 Other forms of dyspnea: Secondary | ICD-10-CM | POA: Diagnosis not present

## 2023-05-24 DIAGNOSIS — I10 Essential (primary) hypertension: Secondary | ICD-10-CM | POA: Diagnosis not present

## 2023-05-24 DIAGNOSIS — G4700 Insomnia, unspecified: Secondary | ICD-10-CM

## 2023-05-24 DIAGNOSIS — I700 Atherosclerosis of aorta: Secondary | ICD-10-CM

## 2023-05-24 DIAGNOSIS — R072 Precordial pain: Secondary | ICD-10-CM

## 2023-05-24 MED ORDER — METOPROLOL TARTRATE 50 MG PO TABS: 50.0000 mg | ORAL_TABLET | Freq: Once | ORAL | 0 refills | Status: DC

## 2023-05-24 NOTE — Progress Notes (Signed)
Cardiology Office Note:    Date:  05/24/2023   ID:  Brandon Burns, Brandon Burns Brandon Burns, MRN [id number]  PCP:  Brandon Jacobson, MD  Cardiologist:  None  Electrophysiologist:  None   Referring MD: Brandon Jacobson, MD   Chief Complaint/Reason for Referral: SOB  History of Present Illness:    Brandon Burns is a 77 y.o. male with a history of CKD stage III, HTN, aortic atherosclerosis, GERD, sleep apnea, HLD who presents as a new patient. I also care for his family member sister-in-law Brandon Burns.   Brandon Burns notes that for the past year and a half he has had exertional shortness of breath as well as some shortness of breath at rest.  He notes that he will just be sitting in his recliner and will get short of breath at rest it will last for seconds.  He also has hills in his yard and has about a half a mile driveway.  When he walks up the hills and out his driveway he will notice upon returning home that he is short of breath and has a left-sided chest discomfort he describes this as a pain that radiates to his left shoulder with onset due to exertion and recovery in 2 to 3 minutes with resting.  Shortness of breath resolves more slowly and takes approximately 5 minutes to fully recover.  He is otherwise able to be active and golfs 3-4 times a week riding the golf cart.  He also has noticed some worsening lower extremity swelling more recent than the onset of his dyspnea on exertion and chest pain.  At the advice of a friend's pastor he started aspirin 81 mg daily.  He notes a family history of coronary artery disease with his mother having multivessel disease and MI.  His lipid panel was last obtained in May 2023 and demonstrated an LDL of 125, triglycerides 657, HDL 39.  For aortic atherosclerosis he was placed on pravastatin 20 mg daily but is not currently taking this to myalgias and statin intolerance.  We discussed that we can determine appropriate therapy after ischemic testing to best  tailor his regimen.  The patient denies palpitations, PND, orthopnea. Denies cough, fever, chills. Denies nausea, vomiting. Denies syncope or presyncope. Denies dizziness or lightheadedness.   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   aspirin EC 81 MG tablet Take 81 mg by mouth daily. Swallow whole.   Cholecalciferol (VITAMIN D3) 50 MCG (2000 UT) TABS Take 2,000 Units by mouth daily.   metoprolol tartrate (LOPRESSOR) 50 MG tablet Take 1 tablet (50 mg total) by mouth once for 1 dose. Take 2 hours prior to procedure.   omeprazole (PRILOSEC  OTC) 20 MG tablet Take 20 mg by mouth daily before breakfast.   tiZANidine (ZANAFLEX) 4 MG tablet Take 4 mg by mouth at bedtime.   vitamin B-12 (CYANOCOBALAMIN) 500 MCG tablet Take 500-1,000 mcg by mouth daily.   zolpidem (AMBIEN) 10 MG tablet Take 10 mg by mouth at bedtime.     Allergies:   Patient has no known allergies.   Social History   Tobacco Use   Smoking status: Never   Smokeless tobacco: Never  Vaping Use   Vaping Use: Never used  Substance Use Topics   Alcohol use: No   Drug use: No     Family History: The patient's family history is not on file.  ROS:   Please see  the history of present illness.    All other systems reviewed and are negative.  EKGs/Labs/Other Studies Reviewed:    The following studies were reviewed today:  EKG:  EKG Interpretation  Date/Time:  Wednesday May 24 2023 13:55:54 EDT Ventricular Rate:  67 PR Interval:  168 QRS Duration: 78 QT Interval:  430 QTC Calculation: 454 R Axis:   41 Text Interpretation: Normal sinus rhythm Normal ECG When compared with ECG of 27-Jan-2023 21:10, No significant change was found Confirmed by Weston Brass ([id number]) on 05/24/2023 2:02:29 PM   Imaging studies that I have independently reviewed today: n/a  Recent Labs: 01/26/2023: ALT 14 01/27/2023: Magnesium 1.8 01/28/2023: BUN 21; Creatinine, Ser 1.27; Hemoglobin 12.5; Platelets 175; Potassium 4.4; Sodium 138  Recent Lipid Panel No results found for: "CHOL", "TRIG", "HDL", "CHOLHDL", "VLDL", "LDLCALC", "LDLDIRECT"  Physical Exam:    VS:  BP 126/64 (BP Location: Right Arm, Patient Position: Sitting, Cuff Size: Normal)   Pulse 70   Ht 6\' 1"  (1.854 m)   Wt 216 lb 9.6 oz (98.2 kg)   SpO2 96%   BMI 28.58 kg/m     Wt Readings from Last 5 Encounters:  05/24/23 216 lb 9.6 oz (98.2 kg)  02/08/23 206 lb (93.4 kg)  02/02/23 213 lb 10 oz (96.9 kg)  01/26/23 213 lb 10 oz (96.9 kg)  12/29/22 217 lb (98.4 kg)    Constitutional: No acute distress Eyes: sclera non-icteric, normal conjunctiva and lids ENMT: normal dentition, moist mucous membranes Cardiovascular: regular rhythm, normal rate, no murmur. S1 and S2 normal. No jugular venous distention.  Respiratory: clear to auscultation bilaterally GI : normal bowel sounds, soft and nontender. No distention.   MSK: extremities warm, well perfused. No edema.  NEURO: grossly nonfocal exam, moves all extremities. PSYCH: alert and oriented x 3, normal mood and affect.   ASSESSMENT:    1. Aortic atherosclerosis (HCC)   2. Essential (primary) hypertension   3. Mixed hyperlipidemia   4. Insomnia  with sleep apnea   5. Precordial pain   6. Dyspnea on exertion    PLAN:    Aortic atherosclerosis (HCC) - Plan: EKG 12-Lead, ECHOCARDIOGRAM COMPLETE, Basic metabolic panel  Essential (primary) hypertension - Plan: ECHOCARDIOGRAM COMPLETE, Basic metabolic panel  Mixed hyperlipidemia - Plan: ECHOCARDIOGRAM COMPLETE, Basic metabolic panel  Insomnia with sleep apnea - Plan: ECHOCARDIOGRAM COMPLETE, Basic metabolic panel  Precordial pain - Plan: ECHOCARDIOGRAM COMPLETE, CT CORONARY MORPH W/CTA COR W/SCORE W/CA W/CM &/OR WO/CM, Basic metabolic panel  Dyspnea on exertion - Plan: ECHOCARDIOGRAM COMPLETE, Basic metabolic panel  Patient presents with chest pain which is typical cardiac chest pain and dyspnea on exertion, concerning for coronary artery disease.  Patient does have a history of aortic atherosclerosis on a prior scan  and mild hyperlipidemia.  Also is on low-dose therapy for hypertension and has a family history of CAD.  No smoking history.  He warrants an ischemic evaluation.  Patient has some discomfort in his ankle currently and feels he could not run on a treadmill but may be able to walk.  Given concerns of limited exercise tolerance and suboptimal stress potential, would recommend cross-sectional imaging with coronary CTA.  Will provide heart rate control for image quality.  No contraindication to contrast dye.  Does have CKD stage III, most recent creatinine reviewed approximately 1.18.  If patient hydrates well, no absolute contraindication.  Recommend echocardiogram to evaluate diastolic function given concerns of shortness of breath with activity.  Will also evaluate systolic function and patient does have a concern of lower extremity edema, echocardiogram to evaluate.  We will determine cholesterol therapy with the information from total plaque volume on his CT.  Can continue amlodipine 2.5 mg daily for hypertension as his blood pressure is well-controlled.  Follow-up with test  results.  Total time of encounter: 45 minutes total time of encounter, including 30 minutes spent in face-to-face patient care on the date of this encounter. This time includes coordination of care and counseling regarding above mentioned problem list. Remainder of non-face-to-face time involved reviewing chart documents/testing relevant to the patient encounter and documentation in the medical record. I have independently reviewed documentation from referring provider.   Weston Brass, MD, [facility]  [facility]   Shared Decision Making/Informed Consent:       Medication Adjustments/Labs and Tests Ordered: Current medicines are reviewed at length with the patient today.  Concerns regarding medicines are outlined above.   Orders Placed This Encounter  Procedures   CT CORONARY MORPH W/CTA COR W/SCORE W/CA W/CM &/OR WO/CM   Basic metabolic panel   EKG 12-Lead   ECHOCARDIOGRAM COMPLETE    Meds ordered this encounter  Medications   metoprolol tartrate (LOPRESSOR) 50 MG tablet    Sig: Take 1 tablet (50 mg total) by mouth once for 1 dose. Take 2 hours prior to procedure.    Dispense:  1 tablet    Refill:  0    Patient Instructions  Medication Instructions:  Your physician recommends that you continue on your current medications as directed. Please refer to the Current Medication list given to you today.  *If you need a refill on your cardiac medications before your next appointment, please call your pharmacy*   Lab Work: Your physician recommends that you have labs drawn today: BMET  If you have labs (blood work) drawn today and your tests are completely normal, you will receive your results only by: MyChart Message (if you have MyChart) OR A paper copy in the mail If you have any lab test that is abnormal or we need to change your treatment, we will call you to review the results.   Testing/Procedures: Your physician has requested that you have an  echocardiogram. Echocardiography is a painless test that uses sound waves to create images of your heart. It provides your doctor with information about the size and shape of your heart and how well your heart's chambers and valves are working. This procedure takes approximately one hour. There are no restrictions for this procedure. Please do NOT wear cologne, perfume, aftershave, or lotions (deodorant is allowed). Please arrive 15 minutes prior to your appointment time. This will take place at [address]    Follow-Up: At [facility], you  and your health needs are our priority.  As part of our continuing mission to provide you with exceptional heart care, we have created designated Provider Care Teams.  These Care Teams include your primary Cardiologist (physician) and Advanced Practice Providers (APPs -  Physician Assistants and Nurse Practitioners) who all work together to provide you with the care you need, when you need it.  We recommend signing up for the patient portal called "MyChart".  Sign up information is provided on this After Visit Summary.  MyChart is used to connect with patients for Virtual Visits (Telemedicine).  Patients are able to view lab/test results, encounter notes, upcoming appointments, etc.  Non-urgent messages can be sent to your provider as well.   To learn more about what you can do with MyChart, go to [email or web address].    Your next appointment:   6 week(s)  Provider:   Weston Brass, MD    Other Instructions   Your cardiac CT will be scheduled at one of the below locations:   [facility] [address] [place], [place] [address] [phone]   If scheduled at [facility] ([facility]), please arrive at the [facility] and Children's Entrance (Entrance C2) of [facility] 30 minutes prior to test start time. You can use the FREE valet parking offered at entrance C (encouraged to control the heart rate for the  test)  Proceed to the [facility] Radiology Department (first floor) to check-in and test prep.  All radiology patients and guests should use entrance C2 at [facility], accessed from [facility], even though the hospital's physical address listed is [address].      Please follow these instructions carefully (unless otherwise directed):  Hold all erectile dysfunction medications at least 3 days (72 hrs) prior to test. (Ie viagra, cialis, sildenafil, tadalafil, etc) We will administer nitroglycerin during this exam.   On the Night Before the Test: Be sure to Drink plenty of water. Do not consume any caffeinated/decaffeinated beverages or chocolate 12 hours prior to your test. Do not take any antihistamines 12 hours prior to your test.  On the Day of the Test: Drink plenty of water until 1 hour prior to the test. Do not eat any food 1 hour prior to test. You may take your regular medications prior to the test.  Take metoprolol (Lopressor) two hours prior to test. If you take Furosemide/Hydrochlorothiazide/Spironolactone, please HOLD on the morning of the test. FEMALES- please wear underwire-free bra if available, avoid dresses & tight clothing       After the Test: Drink plenty of water. After receiving IV contrast, you may experience a mild flushed feeling. This is normal. On occasion, you may experience a mild rash up to 24 hours after the test. This is not dangerous. If this occurs, you can take Benadryl 25 mg and increase your fluid intake. If you experience trouble breathing, this can be serious. If it is severe call 911 IMMEDIATELY. If it is mild, please call our office. If you take any of these medications: Glipizide/Metformin, Avandament, Glucavance, please do not take 48 hours after completing test unless otherwise instructed.  We will call to schedule your test 2-4 weeks out understanding that some insurance companies will need an  authorization prior to the service being performed.   For non-scheduling related questions, please contact the cardiac imaging nurse navigator should you have any questions/concerns: Rockwell Alexandria, Cardiac Imaging Nurse Navigator Larey Brick, Cardiac Imaging Nurse Navigator [place]  [facility] Direct Office Dial: [phone]   For scheduling needs, including cancellations and rescheduling, please call [place], [phone].

## 2023-05-24 NOTE — Patient Instructions (Signed)
Medication Instructions:  Your physician recommends that you continue on your current medications as directed. Please refer to the Current Medication list given to you today.  *If you need a refill on your cardiac medications before your next appointment, please call your pharmacy*   Lab Work: Your physician recommends that you have labs drawn today: BMET  If you have labs (blood work) drawn today and your tests are completely normal, you will receive your results only by: MyChart Message (if you have MyChart) OR A paper copy in the mail If you have any lab test that is abnormal or we need to change your treatment, we will call you to review the results.   Testing/Procedures: Your physician has requested that you have an echocardiogram. Echocardiography is a painless test that uses sound waves to create images of your heart. It provides your doctor with information about the size and shape of your heart and how well your heart's chambers and valves are working. This procedure takes approximately one hour. There are no restrictions for this procedure. Please do NOT wear cologne, perfume, aftershave, or lotions (deodorant is allowed). Please arrive 15 minutes prior to your appointment time. This will take place at [address] [place]    Follow-Up: At [facility], you and your health needs are our priority.  As part of our continuing mission to provide you with exceptional heart care, we have created designated Provider Care Teams.  These Care Teams include your primary Cardiologist (physician) and Advanced Practice Providers (APPs -  Physician Assistants and Nurse Practitioners) who all work together to provide you with the care you need, when you need it.  We recommend signing up for the patient portal called "MyChart".  Sign up information is provided on this After Visit Summary.  MyChart is used to connect with patients for Virtual Visits (Telemedicine).  Patients are able  to view lab/test results, encounter notes, upcoming appointments, etc.  Non-urgent messages can be sent to your provider as well.   To learn more about what you can do with MyChart, go to [email or web address].    Your next appointment:   6 week(s)  Provider:   Weston Brass, MD    Other Instructions   Your cardiac CT will be scheduled at one of the below locations:   [facility] [address] [place], [place] [address] [phone]   If scheduled at [facility], please arrive at the [facility] and Children's Entrance (Entrance C2) of [facility] 30 minutes prior to test start time. You can use the FREE valet parking offered at entrance C (encouraged to control the heart rate for the test)  Proceed to the [facility] Radiology Department (first floor) to check-in and test prep.  All radiology patients and guests should use entrance C2 at [facility], accessed from [facility], even though the hospital's physical address listed is [address]      Please follow these instructions carefully (unless otherwise directed):  Hold all erectile dysfunction medications at least 3 days (72 hrs) prior to test. (Ie viagra, cialis, sildenafil, tadalafil, etc) We will administer nitroglycerin during this exam.   On the Night Before the Test: Be sure to Drink plenty of water. Do not consume any caffeinated/decaffeinated beverages or chocolate 12 hours prior to your test. Do not take any antihistamines 12 hours prior to your test.  On the Day of the Test: Drink plenty of water until 1  hour prior to the test. Do not eat any food 1 hour prior to test. You may take your regular medications prior to the test.  Take metoprolol (Lopressor) two hours prior to test. If you take Furosemide/Hydrochlorothiazide/Spironolactone, please HOLD on the morning of the test. FEMALES- please wear underwire-free bra if available, avoid dresses &  tight clothing       After the Test: Drink plenty of water. After receiving IV contrast, you may experience a mild flushed feeling. This is normal. On occasion, you may experience a mild rash up to 24 hours after the test. This is not dangerous. If this occurs, you can take Benadryl 25 mg and increase your fluid intake. If you experience trouble breathing, this can be serious. If it is severe call 911 IMMEDIATELY. If it is mild, please call our office. If you take any of these medications: Glipizide/Metformin, Avandament, Glucavance, please do not take 48 hours after completing test unless otherwise instructed.  We will call to schedule your test 2-4 weeks out understanding that some insurance companies will need an authorization prior to the service being performed.   For non-scheduling related questions, please contact the cardiac imaging nurse navigator should you have any questions/concerns: Rockwell Alexandria, Cardiac Imaging Nurse Navigator Larey Brick, Cardiac Imaging Nurse Navigator [facility] Direct Office Dial: [phone]   For scheduling needs, including cancellations and rescheduling, please call [place], [phone].

## 2023-05-25 LAB — BASIC METABOLIC PANEL
BUN/Creatinine Ratio: 14 (ref 10–24)
BUN: 20 mg/dL (ref 8–27)
CO2: 25 mmol/L (ref 20–29)
Calcium: 10.1 mg/dL (ref 8.6–10.2)
Chloride: 104 mmol/L (ref 96–106)
Creatinine, Ser: 1.4 mg/dL — ABNORMAL HIGH (ref 0.76–1.27)
Glucose: 97 mg/dL (ref 70–99)
Potassium: 5 mmol/L (ref 3.5–5.2)
Sodium: 143 mmol/L (ref 134–144)
eGFR: 52 mL/min/{1.73_m2} — ABNORMAL LOW (ref >59)

## 2023-05-30 ENCOUNTER — Other Ambulatory Visit: Payer: Self-pay

## 2023-05-30 DIAGNOSIS — R072 Precordial pain: Secondary | ICD-10-CM

## 2023-05-30 NOTE — Progress Notes (Signed)
Orders placed for cardiac stress PET.

## 2023-06-02 ENCOUNTER — Ambulatory Visit (HOSPITAL_COMMUNITY): Payer: PPO

## 2023-06-02 ENCOUNTER — Encounter (HOSPITAL_COMMUNITY): Payer: Self-pay

## 2023-06-05 NOTE — Addendum Note (Signed)
Addended by: Bernita Buffy on: 06/05/2023 02:52 PM   Modules accepted: Orders

## 2023-06-09 ENCOUNTER — Encounter: Payer: Self-pay | Admitting: Internal Medicine

## 2023-06-19 ENCOUNTER — Ambulatory Visit (HOSPITAL_COMMUNITY): Payer: PPO | Attending: Cardiology

## 2023-06-19 DIAGNOSIS — G4730 Sleep apnea, unspecified: Secondary | ICD-10-CM | POA: Diagnosis present

## 2023-06-19 DIAGNOSIS — R0609 Other forms of dyspnea: Secondary | ICD-10-CM | POA: Insufficient documentation

## 2023-06-19 DIAGNOSIS — G4700 Insomnia, unspecified: Secondary | ICD-10-CM | POA: Diagnosis not present

## 2023-06-19 DIAGNOSIS — I10 Essential (primary) hypertension: Secondary | ICD-10-CM

## 2023-06-19 DIAGNOSIS — I700 Atherosclerosis of aorta: Secondary | ICD-10-CM | POA: Diagnosis not present

## 2023-06-19 DIAGNOSIS — R072 Precordial pain: Secondary | ICD-10-CM | POA: Insufficient documentation

## 2023-06-19 DIAGNOSIS — E782 Mixed hyperlipidemia: Secondary | ICD-10-CM | POA: Diagnosis not present

## 2023-06-19 LAB — ECHOCARDIOGRAM COMPLETE
Area-P 1/2: 4.4 cm2
S' Lateral: 2.9 cm

## 2023-06-19 MED ORDER — PERFLUTREN LIPID MICROSPHERE
1.0000 mL | INTRAVENOUS | Status: AC | PRN
  Administered 2023-06-19: 2 mL via INTRAVENOUS

## 2023-06-20 ENCOUNTER — Other Ambulatory Visit: Payer: PPO | Admitting: Urology

## 2023-06-21 ENCOUNTER — Telehealth: Payer: Self-pay | Admitting: Internal Medicine

## 2023-06-21 NOTE — Telephone Encounter (Signed)
Patient's wife would like to speak with Carma Lair, Charity fundraiser. She states RN left a message for patient to reschedule follow-up with Dr. Jacques Navy for after PET scan (7/30), but Dr. Jacques Navy does not have any availability until November. Patient's wife insists on speaking with RN.

## 2023-06-21 NOTE — Telephone Encounter (Signed)
Spoke with pt's wife, Jasmine December (ok per [facility]) regarding follow up office visit with Dr. Jacques Navy. Appointment moved to after pt has his cardiac PET. She verbalizes understanding.

## 2023-06-29 ENCOUNTER — Ambulatory Visit: Payer: PPO | Admitting: Internal Medicine

## 2023-06-29 ENCOUNTER — Encounter (HOSPITAL_COMMUNITY): Payer: Self-pay

## 2023-07-03 ENCOUNTER — Encounter: Payer: Self-pay | Admitting: Urology

## 2023-07-04 ENCOUNTER — Ambulatory Visit (HOSPITAL_COMMUNITY)
Admission: RE | Admit: 2023-07-04 | Discharge: 2023-07-04 | Disposition: A | Discharge status: Home / Self Care | Payer: PPO | Source: Ambulatory Visit | Attending: Internal Medicine | Admitting: Internal Medicine

## 2023-07-04 DIAGNOSIS — R072 Precordial pain: Secondary | ICD-10-CM | POA: Insufficient documentation

## 2023-07-04 MED ORDER — DEXTROSE 5 % IV SOLN
INTRAVENOUS | Status: AC
  Filled 2023-07-04: qty 50

## 2023-07-04 MED ORDER — RUBIDIUM RB82 GENERATOR (RUBYFILL)
25.0000 | PACK | Freq: Once | INTRAVENOUS | Status: AC
  Administered 2023-07-04: 25.28 via INTRAVENOUS

## 2023-07-04 MED ORDER — REGADENOSON 0.4 MG/5ML IV SOLN
INTRAVENOUS | Status: AC
  Filled 2023-07-04: qty 5

## 2023-07-04 MED ORDER — RUBIDIUM RB82 GENERATOR (RUBYFILL)
25.0000 | PACK | Freq: Once | INTRAVENOUS | Status: AC
  Administered 2023-07-04: 25.5 via INTRAVENOUS

## 2023-07-04 MED ORDER — CAFFEINE CITRATE BASE COMPONENT 10 MG/ML IV SOLN
INTRAVENOUS | Status: AC
  Filled 2023-07-04: qty 3

## 2023-07-04 MED ORDER — REGADENOSON 0.4 MG/5ML IV SOLN
0.4000 mg | Freq: Once | INTRAVENOUS | Status: AC
  Administered 2023-07-04: 0.4 mg via INTRAVENOUS

## 2023-07-04 NOTE — Progress Notes (Signed)
Pt had some shortness of breath at the beginning of test

## 2023-07-05 ENCOUNTER — Other Ambulatory Visit: Payer: PPO | Admitting: Urology

## 2023-07-05 ENCOUNTER — Other Ambulatory Visit (HOSPITAL_COMMUNITY): Payer: Self-pay

## 2023-07-05 DIAGNOSIS — E782 Mixed hyperlipidemia: Secondary | ICD-10-CM

## 2023-07-05 MED ORDER — ROSUVASTATIN CALCIUM 20 MG PO TABS: 20.0000 mg | ORAL_TABLET | Freq: Every day | ORAL | 3 refills | Status: DC

## 2023-07-07 ENCOUNTER — Other Ambulatory Visit: Payer: Self-pay

## 2023-07-07 DIAGNOSIS — E782 Mixed hyperlipidemia: Secondary | ICD-10-CM

## 2023-07-07 NOTE — Addendum Note (Signed)
Addended by: Bernita Buffy on: 07/07/2023 09:04 AM   Modules accepted: Orders

## 2023-07-10 ENCOUNTER — Ambulatory Visit: Payer: PPO | Admitting: Internal Medicine

## 2023-08-15 ENCOUNTER — Encounter: Payer: Self-pay | Admitting: Internal Medicine

## 2023-09-04 IMAGING — CT CT RENAL STONE PROTOCOL
2 of 4 series · 16 of 46 positions shown, 18 images · non-contrast
Comparison: 04/21/2015

CLINICAL DATA: Flank pain, suspected kidney stones, 3 weeks of
frequent urination and burning during urination, low back pain since
[REDACTED]

EXAM:
CT ABDOMEN AND PELVIS WITHOUT CONTRAST
TECHNIQUE: Multidetector CT imaging of the abdomen and pelvis was performed
following the standard protocol without IV contrast. Sagittal and
coronal MPR images reconstructed from axial data set. No oral
contrast administered.

[Series 2: axial st · axial · 0.96mm/px · z∈[-570,-90]mm · 13 of 106 slices shown, 15 images]
[im 5/106  soft-tissue]
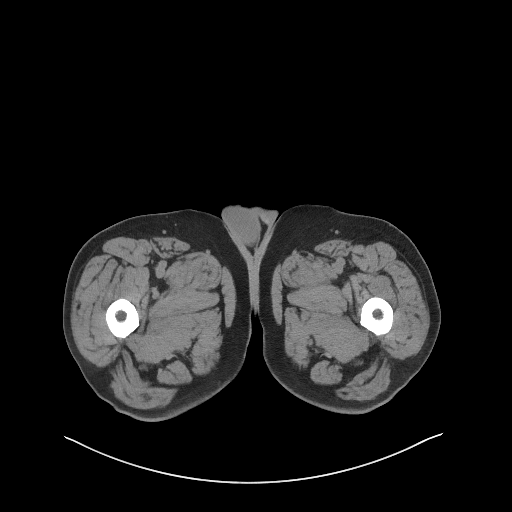
[im 5/106  bone]
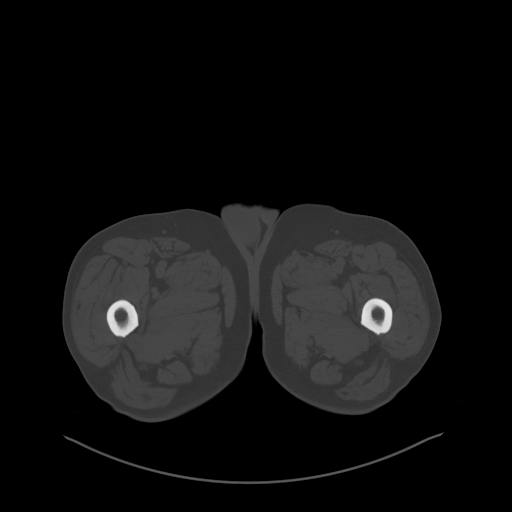
[im 13/106  soft-tissue]
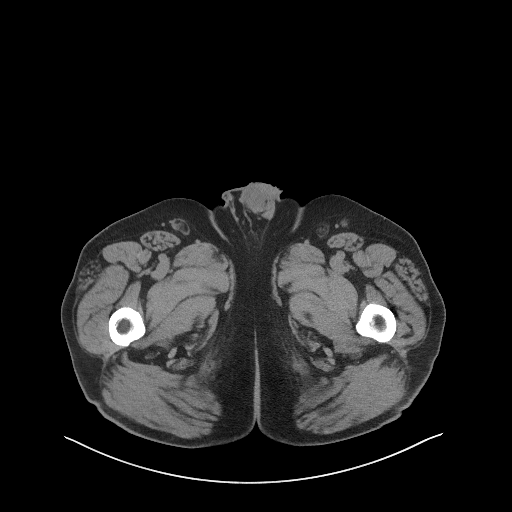
[im 21/106  soft-tissue]
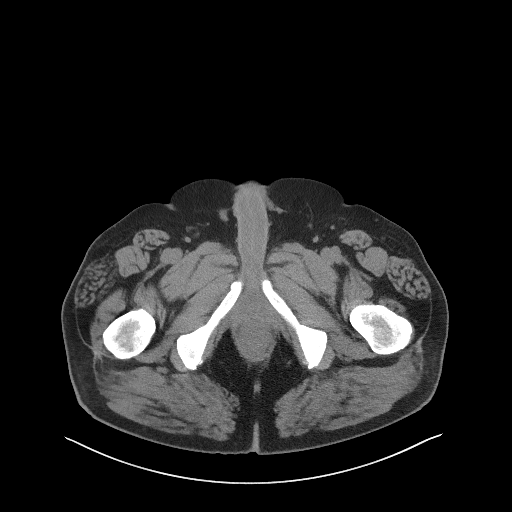
[im 29/106  soft-tissue]
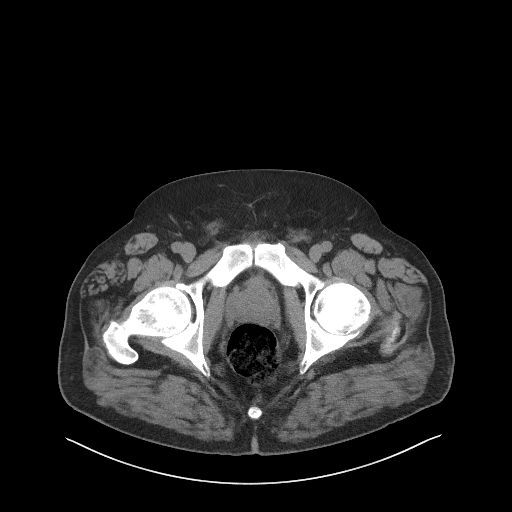
[im 37/106  soft-tissue]
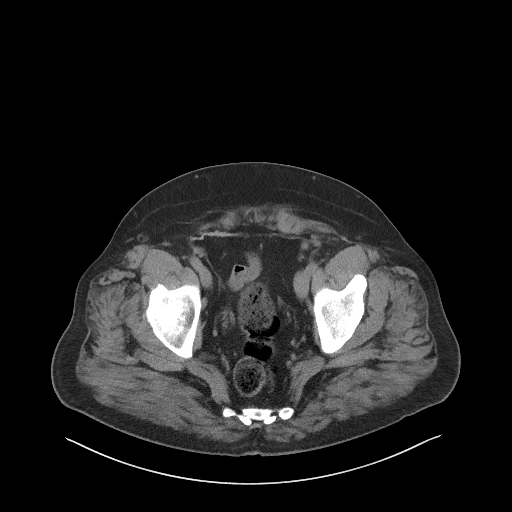
[im 45/106  soft-tissue]
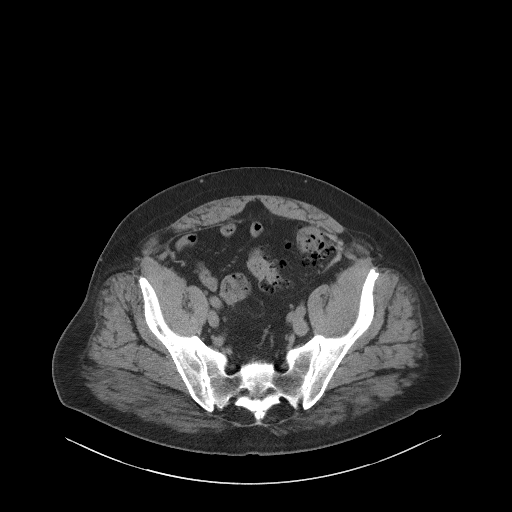
[im 53/106  soft-tissue]
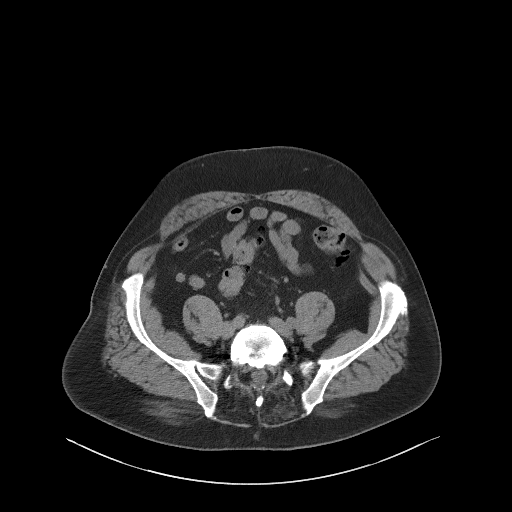
[im 61/106  soft-tissue]
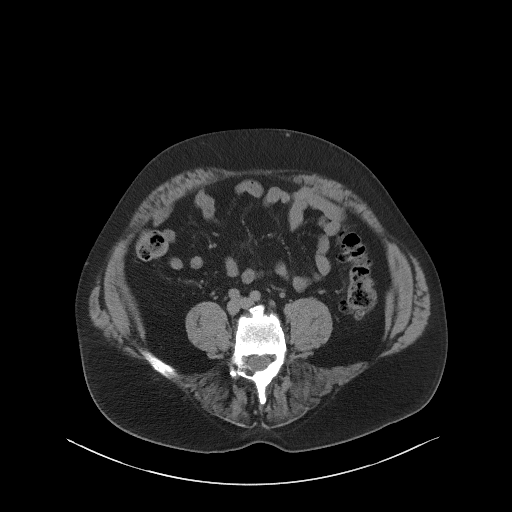
[im 69/106  soft-tissue]
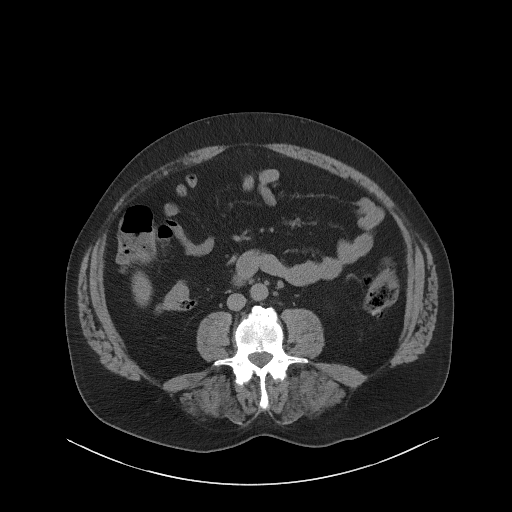
[im 69/106  bone]
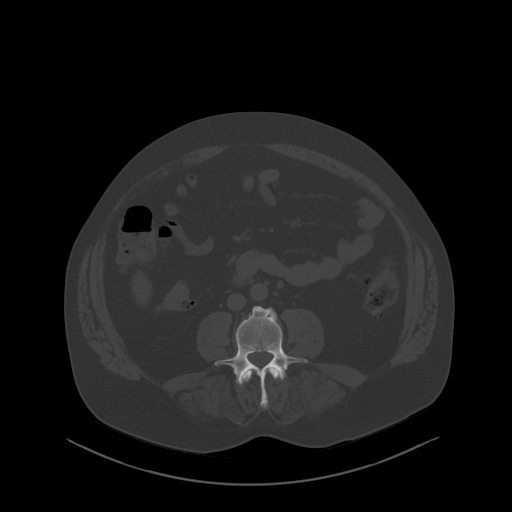
[im 77/106  soft-tissue]
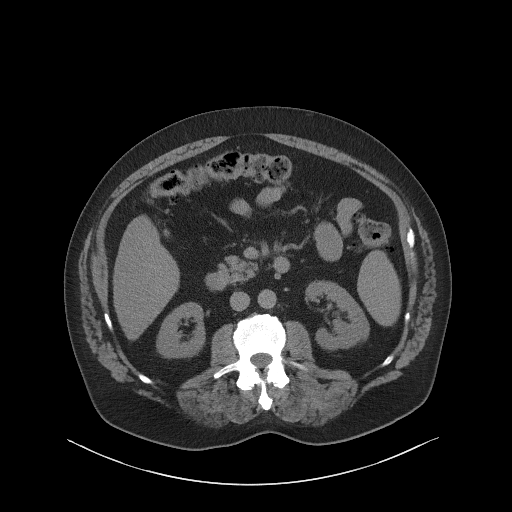
[im 85/106  soft-tissue]
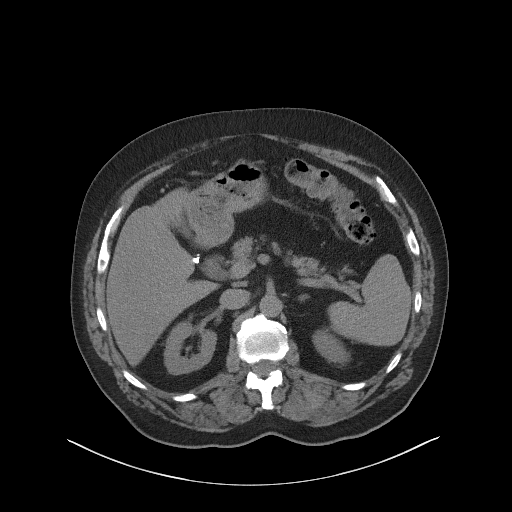
[im 93/106  soft-tissue]
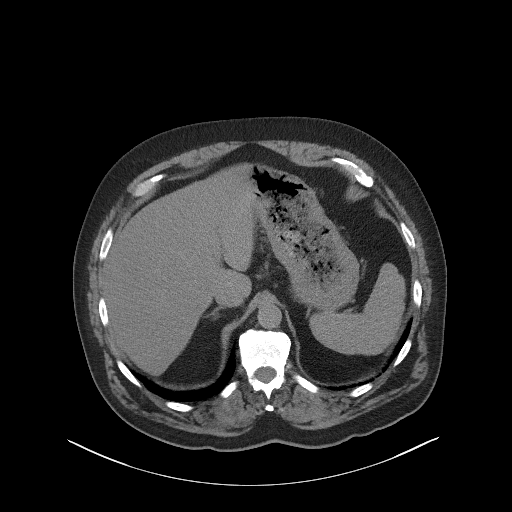
[im 101/106  soft-tissue]
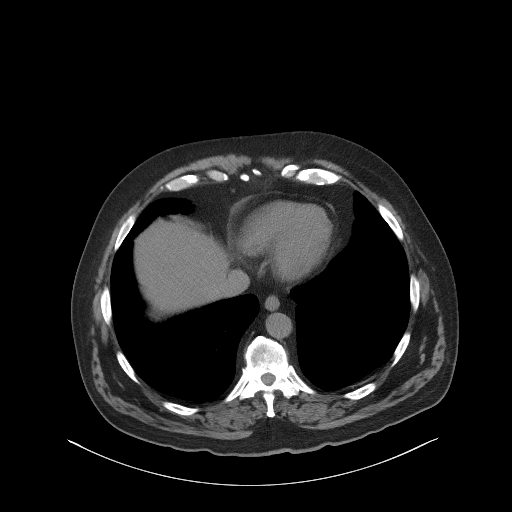

[Series 4: coronal st · coronal · 0.85mm/px · 3 of 114 slices shown]
[im 38/114  soft-tissue]
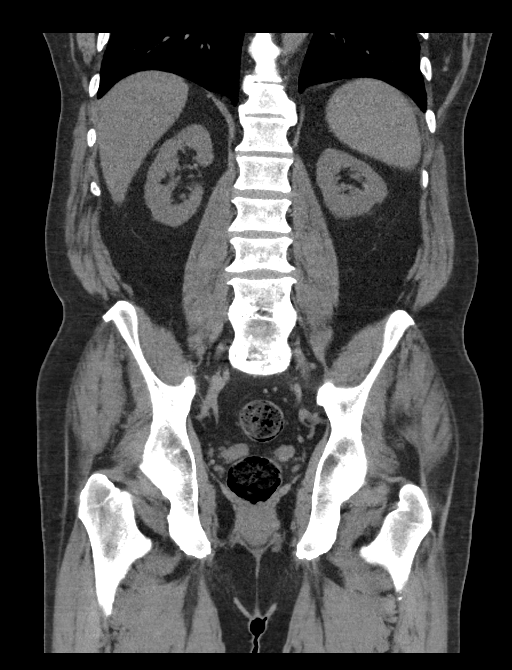
[im 51/114  soft-tissue]
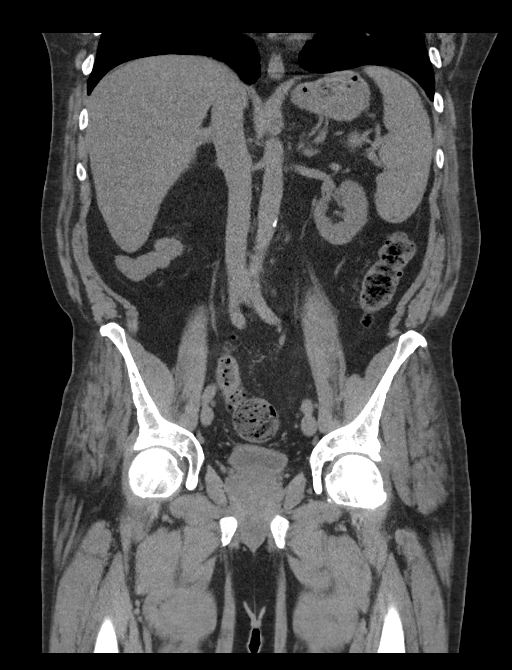
[im 63/114  soft-tissue]
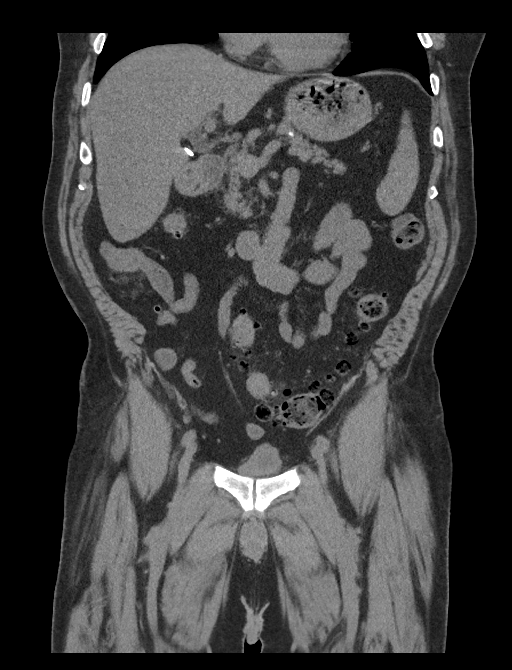

[16 of 46 positions shown; findings below may reference images not displayed]

FINDINGS: Lower chest: Lung bases clear

Hepatobiliary: Gallbladder surgically absent. Liver normal
appearance

Pancreas: Slight prominence of tip of pancreatic tail unchanged
since 3948. Remainder of pancreas normal.

Spleen: Normal appearance

Adrenals/Urinary Tract: Adrenal glands normal appearance. Kidneys,
ureters, and decompressed bladder normal appearance

Stomach/Bowel: Diffuse colonic diverticulosis without evidence of
diverticulitis. Normal appendix. Stomach and bowel loops normal
appearance.

Vascular/Lymphatic: Atherosclerotic calcifications aorta without
aneurysm. No adenopathy.

Reproductive: Unremarkable prostate gland and seminal vesicles

Other: Prior BILATERAL inguinal herniorrhaphy with mesh. Small focus
of fat adjacent to the RIGHT inguinal mesh demonstrates slight
infiltration likely fat necrosis, unchanged. No free air or free
fluid. No inflammatory process.

Musculoskeletal: Scattered degenerative disc disease changes. No
acute osseous findings.
IMPRESSION: Diffuse colonic diverticulosis without evidence of diverticulitis.

No acute intra-abdominal or intrapelvic abnormalities.

Aortic Atherosclerosis ([id number]).

## 2023-10-09 ENCOUNTER — Ambulatory Visit: Payer: PPO | Attending: Internal Medicine | Admitting: Internal Medicine

## 2023-10-09 ENCOUNTER — Other Ambulatory Visit: Payer: Self-pay | Admitting: *Deleted

## 2023-10-09 VITALS — BP 136/80 | HR 56 | Ht 73.0 in | Wt 223.0 lb

## 2023-10-09 DIAGNOSIS — R072 Precordial pain: Secondary | ICD-10-CM | POA: Diagnosis not present

## 2023-10-09 DIAGNOSIS — E782 Mixed hyperlipidemia: Secondary | ICD-10-CM

## 2023-10-09 NOTE — Patient Instructions (Addendum)
Medication Instructions:  Your physician recommends that you continue on your current medications as directed. Please refer to the Current Medication list given to you today.  *If you need a refill on your cardiac medications before your next appointment, please call your pharmacy*  Lab Work: CBC, CMET, Lipid Panel (fasting) week of November 11, before Heart Cath If you have labs (blood work) drawn today and your tests are completely normal, you will receive your results only by: MyChart Message (if you have MyChart) OR A paper copy in the mail If you have any lab test that is abnormal or we need to change your treatment, we will call you to review the results.   Testing/Procedures:  [place] [facility] DEPT OF [facility] AT [facility] [address] [place] [address] [place] [address] Dept: [phone] Loc: [phone]  Brandon Burns  10/09/2023  You are scheduled for a Cardiac Catheterization on Thursday, November 14 with Dr. Verne Carrow.  1. Please arrive at the [facility] (Main Entrance A) at [facility]: [address] [place], [place] [address] at 7:00 AM (This time is 2 hour(s) before your procedure to ensure your preparation). Free valet parking service is available. You will check in at ADMITTING. The support person will be asked to wait in the waiting room.  It is OK to have someone drop you off and come back when you are ready to be discharged.    Special note: Every effort is made to have your procedure done on time. Please understand that emergencies sometimes delay scheduled procedures.  2. Diet: Do not eat solid foods after midnight.  The patient may have clear liquids until 5am upon the day of the procedure.  3. Labs: You will need to have blood drawn the week of October 16, 2023 (FASTING)  4. Medication instructions in preparation for your procedure:   Contrast Allergy: No   On the morning  of your procedure, take your Aspirin 81 mg and any morning medicines NOT listed above.  You may use sips of water.  5. Plan to go home the same day, you will only stay overnight if medically necessary. 6. Bring a current list of your medications and current insurance cards. 7. You MUST have a responsible person to drive you home. 8. Someone MUST be with you the first 24 hours after you arrive home or your discharge will be delayed. 9. Please wear clothes that are easy to get on and off and wear slip-on shoes.  Thank you for allowing [place] to care for you!   -- Brandon Burns Invasive Cardiovascular services    Follow-Up: At [facility], you and your health needs are our priority.  As part of our continuing mission to provide you with exceptional heart care, we have created designated Provider Care Teams.  These Care Teams include your primary Cardiologist (physician) and Advanced Practice Providers (APPs -  Physician Assistants and Nurse Practitioners) who all work together to provide you with the care you need, when you need it.  Your next appointment:   2 week(s)  Provider:   Marjie Skiff, PA-C or Azalee Course, PA-C    Then, Dr. Jacques Navy will see you again in January, 2025.

## 2023-10-09 NOTE — H&P (View-Only) (Signed)
Cardiology Office Note:  .   Date:  10/09/2023  ID:  JUDY GERMER, DOB [birth year], MRN [id number] PCP: Loyal Jacobson, MD  [facility] Providers Cardiologist:  None    History of Present Illness: [name] Kitchen   NIV LUISI is a 77 y.o. male.  Discussed the use of AI scribe software for clinical note transcription with the patient, who gave verbal consent to proceed.  History of Present Illness   The patient, with a history of coronary calcifications, presented with ongoing chest discomfort, particularly when walking uphill or on his driveway. The discomfort is described as being located on the left side of the chest, radiating down the left arm, and is often associated with exertion. The patient reported that the discomfort is not severe and usually resolves quickly. There was one episode about four to five weeks ago when the discomfort was more pronounced, but it did not result in seeking immediate medical attention.  The patient has a history of bilateral knee replacements, which limits his ability to engage in certain physical activities such as running on a treadmill. He also reported having slightly elevated blood pressure on the day of the consultation, which he attributed to stress. However, he denied any association between his chest discomfort and episodes of high blood pressure.  The patient underwent a PET-CT MBF scan and an echocardiogram in June. The PET scan showed mild coronary calcifications, but the perfusion pictures were normal, though the myocardial blood flow was borderline. The echocardiogram was also reported as normal. The patient expressed a desire to lose weight and improve his physical fitness in preparation for his upcoming wedding in June.  The patient has a history of intolerance to cholesterol medication (rosuvastatin), which he reported made him feel lethargic. He expressed a preference for not taking any medication unless absolutely necessary. The patient also  reported a history of kidney issues (mild renal insufficiency), which influenced the decision to opt for a PET scan over a CT scan.  The patient's goal is to be able to engage in physical activities without experiencing chest discomfort. He expressed a willingness to undergo a heart catheterization to definitively determine the cause of his symptoms and to guide further management.        ROS: negative except per HPI above.  Studies Reviewed: .        Results   RADIOLOGY PET scan: Normal blood flow, mild coronary calcifications, blood flow calculation (MBFR) 1.97  DIAGNOSTIC Echocardiogram: Normal     Risk Assessment/Calculations:          Physical Exam:   VS:  BP 136/80 (BP Location: Left Arm, Patient Position: Sitting, Cuff Size: Large)   Pulse (!) 56   Ht 6\' 1"  (1.854 m)   Wt 223 lb (101.2 kg)   SpO2 97%   BMI 29.42 kg/m    Wt Readings from Last 3 Encounters:  10/09/23 223 lb (101.2 kg)  05/24/23 216 lb 9.6 oz (98.2 kg)  02/08/23 206 lb (93.4 kg)     Physical Exam   VITALS: BP- 136/80 CHEST: Lungs clear to auscultation. CARDIOVASCULAR: Heart sounds normal.     GEN: Well nourished, well developed in no acute distress NECK: No JVD; No carotid bruits CARDIAC: RRR, no murmurs, rubs, gallops RESPIRATORY:  Clear to auscultation without rales, wheezing or rhonchi  ABDOMEN: Soft, non-tender, non-distended EXTREMITIES:  No edema; No deformity   ASSESSMENT AND PLAN: .    1. Precordial pain   2. Mixed hyperlipidemia  Assessment and Plan    Chest Pain Exertional chest pain, worse with uphill walking. PET scan showed coronary calcifications and slightly abnormal blood flow calculation (1.97, normal is 2 or above). Echocardiogram was normal. Discussed the risk/benefit of further testing vs medical management. - Proceed with cardiac catheterization to definitively assess for coronary artery disease. - Consider starting Imdur for symptom relief and blood pressure  control, pending results of catheterization. I offered this to the patient in lieu of cardiac cath, pt prefers definitive coronary anatomy assessment.  Hyperlipidemia Previously on rosuvastatin, but discontinued due to side effects. Discussed the option of injectable cholesterol medications. - Recheck cholesterol levels. - Consider injectable cholesterol medication pending results of cholesterol levels and cardiac catheterization.  Hypertension Blood pressure slightly elevated at today's visit (136/80), patient reports stressors. - Continue current management, recheck blood pressure at next visit.  Renal Function Mildly impaired, possibly secondary to past exposures at [facility] and lifestyle factors. Discussed the risk of contrast-induced nephropathy with cardiac catheterization. - Recheck renal function prior to cardiac catheterization.            Informed Consent   Shared Decision Making/Informed Consent The risks [stroke (1 in 1000), death (1 in 1000), kidney failure [usually temporary] (1 in 500), bleeding (1 in 200), allergic reaction [possibly serious] (1 in 200)], benefits (diagnostic support and management of coronary artery disease) and alternatives of a cardiac catheterization were discussed in detail with [name] and he is willing to proceed.      Total time of encounter: 30 minutes total time of encounter, including 20 minutes spent in face-to-face patient care on the date of this encounter. This time includes coordination of care and counseling regarding above mentioned problem list. Remainder of non-face-to-face time involved reviewing chart documents/testing relevant to the patient encounter and documentation in the medical record. I have independently reviewed documentation from referring provider.   Weston Brass, MD, [facility]  [facility]

## 2023-10-09 NOTE — Progress Notes (Signed)
Cardiology Office Note:  .   Date:  10/09/2023  ID:  Brandon Burns, DOB [birth year], MRN [id number] PCP: Brandon Jacobson, MD  [facility] Providers Cardiologist:  None    History of Present Illness: Brandon Burns Kitchen   Brandon Burns is a 77 y.o. male.  Discussed the use of AI scribe software for clinical note transcription with the patient, who gave verbal consent to proceed.  History of Present Illness   The patient, with a history of coronary calcifications, presented with ongoing chest discomfort, particularly when walking uphill or on his driveway. The discomfort is described as being located on the left side of the chest, radiating down the left arm, and is often associated with exertion. The patient reported that the discomfort is not severe and usually resolves quickly. There was one episode about four to five weeks ago when the discomfort was more pronounced, but it did not result in seeking immediate medical attention.  The patient has a history of bilateral knee replacements, which limits his ability to engage in certain physical activities such as running on a treadmill. He also reported having slightly elevated blood pressure on the day of the consultation, which he attributed to stress. However, he denied any association between his chest discomfort and episodes of high blood pressure.  The patient underwent a PET-CT MBF scan and an echocardiogram in June. The PET scan showed mild coronary calcifications, but the perfusion pictures were normal, though the myocardial blood flow was borderline. The echocardiogram was also reported as normal. The patient expressed a desire to lose weight and improve his physical fitness in preparation for his upcoming wedding in June.  The patient has a history of intolerance to cholesterol medication (rosuvastatin), which he reported made him feel lethargic. He expressed a preference for not taking any medication unless absolutely necessary. The patient also  reported a history of kidney issues (mild renal insufficiency), which influenced the decision to opt for a PET scan over a CT scan.  The patient's goal is to be able to engage in physical activities without experiencing chest discomfort. He expressed a willingness to undergo a heart catheterization to definitively determine the cause of his symptoms and to guide further management.        ROS: negative except per HPI above.  Studies Reviewed: .        Results   RADIOLOGY PET scan: Normal blood flow, mild coronary calcifications, blood flow calculation (MBFR) 1.97  DIAGNOSTIC Echocardiogram: Normal     Risk Assessment/Calculations:          Physical Exam:   VS:  BP 136/80 (BP Location: Left Arm, Patient Position: Sitting, Cuff Size: Large)   Pulse (!) 56   Ht 6\' 1"  (1.854 m)   Wt 223 lb (101.2 kg)   SpO2 97%   BMI 29.42 kg/m    Wt Readings from Last 3 Encounters:  10/09/23 223 lb (101.2 kg)  05/24/23 216 lb 9.6 oz (98.2 kg)  02/08/23 206 lb (93.4 kg)     Physical Exam   VITALS: BP- 136/80 CHEST: Lungs clear to auscultation. CARDIOVASCULAR: Heart sounds normal.     GEN: Well nourished, well developed in no acute distress NECK: No JVD; No carotid bruits CARDIAC: RRR, no murmurs, rubs, gallops RESPIRATORY:  Clear to auscultation without rales, wheezing or rhonchi  ABDOMEN: Soft, non-tender, non-distended EXTREMITIES:  No edema; No deformity   ASSESSMENT AND PLAN: .    1. Precordial pain   2. Mixed hyperlipidemia  Assessment and Plan    Chest Pain Exertional chest pain, worse with uphill walking. PET scan showed coronary calcifications and slightly abnormal blood flow calculation (1.97, normal is 2 or above). Echocardiogram was normal. Discussed the risk/benefit of further testing vs medical management. - Proceed with cardiac catheterization to definitively assess for coronary artery disease. - Consider starting Imdur for symptom relief and blood pressure  control, pending results of catheterization. I offered this to the patient in lieu of cardiac cath, pt prefers definitive coronary anatomy assessment.  Hyperlipidemia Previously on rosuvastatin, but discontinued due to side effects. Discussed the option of injectable cholesterol medications. - Recheck cholesterol levels. - Consider injectable cholesterol medication pending results of cholesterol levels and cardiac catheterization.  Hypertension Blood pressure slightly elevated at today's visit (136/80), patient reports stressors. - Continue current management, recheck blood pressure at next visit.  Renal Function Mildly impaired, possibly secondary to past exposures at [facility] and lifestyle factors. Discussed the risk of contrast-induced nephropathy with cardiac catheterization. - Recheck renal function prior to cardiac catheterization.            Informed Consent   Shared Decision Making/Informed Consent The risks [stroke (1 in 1000), death (1 in 1000), kidney failure [usually temporary] (1 in 500), bleeding (1 in 200), allergic reaction [possibly serious] (1 in 200)], benefits (diagnostic support and management of coronary artery disease) and alternatives of a cardiac catheterization were discussed in detail with Brandon Burns and he is willing to proceed.      Total time of encounter: 30 minutes total time of encounter, including 20 minutes spent in face-to-face patient care on the date of this encounter. This time includes coordination of care and counseling regarding above mentioned problem list. Remainder of non-face-to-face time involved reviewing chart documents/testing relevant to the patient encounter and documentation in the medical record. I have independently reviewed documentation from referring provider.   Brandon Brass, MD, [facility]  [facility]

## 2023-10-16 ENCOUNTER — Encounter: Payer: Self-pay | Admitting: Internal Medicine

## 2023-10-16 LAB — CBC
Hematocrit: 44.3 % (ref 37.5–51.0)
Hemoglobin: 14.1 g/dL (ref 13.0–17.7)
MCH: 29.2 pg (ref 26.6–33.0)
MCHC: 31.8 g/dL (ref 31.5–35.7)
MCV: 92 fL (ref 79–97)
Platelets: 197 10*3/uL (ref 150–450)
RBC: 4.83 x10E6/uL (ref 4.14–5.80)
RDW: 13 % (ref 11.6–15.4)
WBC: 8.2 10*3/uL (ref 3.4–10.8)

## 2023-10-17 ENCOUNTER — Telehealth: Payer: Self-pay | Admitting: *Deleted

## 2023-10-17 LAB — COMPREHENSIVE METABOLIC PANEL
ALT: 23 [IU]/L (ref 0–44)
AST: 22 [IU]/L (ref 0–40)
Albumin: 4.3 g/dL (ref 3.8–4.8)
Alkaline Phosphatase: 88 [IU]/L (ref 44–121)
BUN/Creatinine Ratio: 13 (ref 10–24)
BUN: 18 mg/dL (ref 8–27)
Bilirubin Total: 0.2 mg/dL (ref 0.0–1.2)
CO2: 25 mmol/L (ref 20–29)
Calcium: 9.4 mg/dL (ref 8.6–10.2)
Chloride: 103 mmol/L (ref 96–106)
Creatinine, Ser: 1.39 mg/dL — ABNORMAL HIGH (ref 0.76–1.27)
Globulin, Total: 2.4 g/dL (ref 1.5–4.5)
Glucose: 101 mg/dL — ABNORMAL HIGH (ref 70–99)
Potassium: 4.9 mmol/L (ref 3.5–5.2)
Sodium: 140 mmol/L (ref 134–144)
Total Protein: 6.7 g/dL (ref 6.0–8.5)
eGFR: 53 mL/min/{1.73_m2} — ABNORMAL LOW (ref >59)

## 2023-10-17 LAB — LIPID PANEL
Chol/HDL Ratio: 4.7 ratio (ref 0.0–5.0)
Cholesterol, Total: 202 mg/dL — ABNORMAL HIGH (ref 100–199)
HDL: 43 mg/dL (ref >39)
LDL Chol Calc (NIH): 123 mg/dL — ABNORMAL HIGH (ref 0–99)
Triglycerides: 205 mg/dL — ABNORMAL HIGH (ref 0–149)
VLDL Cholesterol Cal: 36 mg/dL (ref 5–40)

## 2023-10-17 NOTE — Telephone Encounter (Signed)
Cardiac Catheterization scheduled at [facility] for: Thursday October 19, 2023 9 AM  Arrival time [facility] Main Entrance A at: 7 AM  Nothing to eat after midnight prior to procedure, clear liquids until 5 AM day of procedure.  Medication instructions: -Usual morning medications can be taken with sips of water including aspirin 81 mg.  Plan to go home the same day, you will only stay overnight if medically necessary.  You must have responsible adult to drive you home.  Someone must be with you the first 24 hours after you arrive home.  Reviewed procedure instructions with patient.

## 2023-10-19 ENCOUNTER — Ambulatory Visit (HOSPITAL_COMMUNITY)
Admission: RE | Admit: 2023-10-19 | Discharge: 2023-10-19 | Disposition: A | Discharge status: Home / Self Care | Payer: PPO | Attending: Cardiovascular Disease | Admitting: Cardiovascular Disease

## 2023-10-19 ENCOUNTER — Encounter (HOSPITAL_COMMUNITY)
Admission: RE | Disposition: A | Discharge status: Home / Self Care | Payer: Self-pay | Source: Home / Self Care | Attending: Cardiovascular Disease

## 2023-10-19 ENCOUNTER — Other Ambulatory Visit: Payer: Self-pay

## 2023-10-19 DIAGNOSIS — I10 Essential (primary) hypertension: Secondary | ICD-10-CM | POA: Insufficient documentation

## 2023-10-19 DIAGNOSIS — I2510 Atherosclerotic heart disease of native coronary artery without angina pectoris: Secondary | ICD-10-CM | POA: Insufficient documentation

## 2023-10-19 DIAGNOSIS — N289 Disorder of kidney and ureter, unspecified: Secondary | ICD-10-CM | POA: Insufficient documentation

## 2023-10-19 DIAGNOSIS — R072 Precordial pain: Secondary | ICD-10-CM | POA: Diagnosis present

## 2023-10-19 DIAGNOSIS — Z79899 Other long term (current) drug therapy: Secondary | ICD-10-CM | POA: Insufficient documentation

## 2023-10-19 DIAGNOSIS — Z96653 Presence of artificial knee joint, bilateral: Secondary | ICD-10-CM | POA: Insufficient documentation

## 2023-10-19 DIAGNOSIS — E782 Mixed hyperlipidemia: Secondary | ICD-10-CM | POA: Diagnosis not present

## 2023-10-19 HISTORY — PX: LEFT HEART CATH AND CORONARY ANGIOGRAPHY: CATH118249

## 2023-10-19 SURGERY — LEFT HEART CATH AND CORONARY ANGIOGRAPHY
Anesthesia: LOCAL

## 2023-10-19 MED ORDER — ONDANSETRON HCL 4 MG/2ML IJ SOLN: 4.0000 mg | Freq: Four times a day (QID) | INTRAMUSCULAR | Status: DC | PRN

## 2023-10-19 MED ORDER — VERAPAMIL HCL 2.5 MG/ML IV SOLN
INTRAVENOUS | Status: DC | PRN
  Administered 2023-10-19: 10 mL via INTRA_ARTERIAL

## 2023-10-19 MED ORDER — HEPARIN SODIUM (PORCINE) 1000 UNIT/ML IJ SOLN
INTRAMUSCULAR | Status: DC | PRN
  Administered 2023-10-19: 5000 [IU] via INTRAVENOUS

## 2023-10-19 MED ORDER — LABETALOL HCL 5 MG/ML IV SOLN: 10.0000 mg | INTRAVENOUS | Status: DC | PRN

## 2023-10-19 MED ORDER — FENTANYL CITRATE (PF) 100 MCG/2ML IJ SOLN
INTRAMUSCULAR | Status: AC
  Filled 2023-10-19: qty 2

## 2023-10-19 MED ORDER — SODIUM CHLORIDE 0.9 % WEIGHT BASED INFUSION
3.0000 mL/kg/h | INTRAVENOUS | Status: AC
  Administered 2023-10-19: 3 mL/kg/h via INTRAVENOUS

## 2023-10-19 MED ORDER — HYDRALAZINE HCL 20 MG/ML IJ SOLN: 10.0000 mg | INTRAMUSCULAR | Status: DC | PRN

## 2023-10-19 MED ORDER — FENTANYL CITRATE (PF) 100 MCG/2ML IJ SOLN
INTRAMUSCULAR | Status: DC | PRN
  Administered 2023-10-19: 50 ug via INTRAVENOUS

## 2023-10-19 MED ORDER — ACETAMINOPHEN 325 MG PO TABS: 650.0000 mg | ORAL_TABLET | ORAL | Status: DC | PRN

## 2023-10-19 MED ORDER — VERAPAMIL HCL 2.5 MG/ML IV SOLN
INTRAVENOUS | Status: AC
  Filled 2023-10-19: qty 2

## 2023-10-19 MED ORDER — MIDAZOLAM HCL 2 MG/2ML IJ SOLN
INTRAMUSCULAR | Status: AC
  Filled 2023-10-19: qty 2

## 2023-10-19 MED ORDER — LIDOCAINE HCL (PF) 1 % IJ SOLN
INTRAMUSCULAR | Status: DC | PRN
  Administered 2023-10-19: 5 mL

## 2023-10-19 MED ORDER — HEPARIN (PORCINE) IN NACL 1000-0.9 UT/500ML-% IV SOLN
INTRAVENOUS | Status: DC | PRN
  Administered 2023-10-19 (×2): 500 mL

## 2023-10-19 MED ORDER — HEPARIN SODIUM (PORCINE) 1000 UNIT/ML IJ SOLN
INTRAMUSCULAR | Status: AC
  Filled 2023-10-19: qty 10

## 2023-10-19 MED ORDER — SODIUM CHLORIDE 0.9 % WEIGHT BASED INFUSION: 1.0000 mL/kg/h | INTRAVENOUS | Status: DC

## 2023-10-19 MED ORDER — LIDOCAINE HCL (PF) 1 % IJ SOLN
INTRAMUSCULAR | Status: AC
  Filled 2023-10-19: qty 30

## 2023-10-19 MED ORDER — MIDAZOLAM HCL 2 MG/2ML IJ SOLN
INTRAMUSCULAR | Status: DC | PRN
  Administered 2023-10-19: 2 mg via INTRAVENOUS

## 2023-10-19 MED ORDER — ASPIRIN 81 MG PO CHEW: 81.0000 mg | CHEWABLE_TABLET | ORAL | Status: DC

## 2023-10-19 MED ORDER — SODIUM CHLORIDE 0.9 % IV SOLN: INTRAVENOUS | Status: AC

## 2023-10-19 MED ORDER — IOHEXOL 350 MG/ML SOLN
INTRAVENOUS | Status: DC | PRN
  Administered 2023-10-19: 32 mL

## 2023-10-19 MED ORDER — SODIUM CHLORIDE 0.9 % IV SOLN: 250.0000 mL | INTRAVENOUS | Status: DC | PRN

## 2023-10-19 SURGICAL SUPPLY — 9 items
CATH 5FR JL3.5 JR4 ANG PIG MP (CATHETERS) IMPLANT
CATH INFINITI 5FR AL1 (CATHETERS) IMPLANT
DEVICE RAD COMP TR BAND LRG (VASCULAR PRODUCTS) IMPLANT
GLIDESHEATH SLEND SS 6F .021 (SHEATH) IMPLANT
GUIDEWIRE INQWIRE 1.5J.035X260 (WIRE) IMPLANT
INQWIRE 1.5J .035X260CM (WIRE) ×1
KIT SYRINGE INJ CVI SPIKEX1 (MISCELLANEOUS) IMPLANT
PACK CARDIAC CATHETERIZATION (CUSTOM PROCEDURE TRAY) ×1 IMPLANT
SET ATX-X65L (MISCELLANEOUS) IMPLANT

## 2023-10-19 NOTE — Interval H&P Note (Signed)
History and Physical Interval Note:  10/19/2023 7:28 AM  Brandon Burns  has presented today for surgery, with the diagnosis of chest pain.  The various methods of treatment have been discussed with the patient and family. After consideration of risks, benefits and other options for treatment, the patient has consented to  Procedure(s): LEFT HEART CATH AND CORONARY ANGIOGRAPHY (N/A) as a surgical intervention.  The patient's history has been reviewed, patient examined, no change in status, stable for surgery.  I have reviewed the patient's chart and labs.  Questions were answered to the patient's satisfaction.    Cath Lab Visit (complete for each Cath Lab visit)  Clinical Evaluation Leading to the Procedure:   ACS: No.  Non-ACS:    Anginal Classification: CCS III  Anti-ischemic medical therapy: Minimal Therapy (1 class of medications)  Non-Invasive Test Results: Low-risk stress test findings: cardiac mortality <1%/year  Prior CABG: No previous CABG        Verne Carrow

## 2023-10-20 ENCOUNTER — Encounter (HOSPITAL_COMMUNITY): Payer: Self-pay | Admitting: Cardiovascular Disease

## 2023-10-25 ENCOUNTER — Ambulatory Visit: Payer: PPO | Admitting: Physician Assistant

## 2023-11-07 ENCOUNTER — Telehealth: Payer: Self-pay | Admitting: *Deleted

## 2023-11-07 ENCOUNTER — Other Ambulatory Visit: Payer: Self-pay | Admitting: *Deleted

## 2023-11-07 DIAGNOSIS — E7800 Pure hypercholesterolemia, unspecified: Secondary | ICD-10-CM

## 2023-11-07 DIAGNOSIS — E782 Mixed hyperlipidemia: Secondary | ICD-10-CM

## 2023-11-07 NOTE — Telephone Encounter (Signed)
-----   Message from Parke Poisson sent at 11/02/2023 12:14 PM EST ----- Lipids above goal for secondary prevention of cad. Recommend [facility] pharm d lipid clinic referral for [id number] eval.

## 2023-11-07 NOTE — Telephone Encounter (Signed)
Patient  states  he may be interested in trying medication . Patient is aware  he may or not have completed appointment with [facility] prior his appointment with Dr Jacques Navy on 01/03/24 Patient states he was able to take statins  due to weakness and muscle issues.   Order placed for [facility]- [facility]

## 2023-12-08 ENCOUNTER — Encounter (HOSPITAL_BASED_OUTPATIENT_CLINIC_OR_DEPARTMENT_OTHER): Payer: Self-pay | Admitting: Emergency Medicine

## 2023-12-08 ENCOUNTER — Emergency Department (HOSPITAL_BASED_OUTPATIENT_CLINIC_OR_DEPARTMENT_OTHER)
Admission: EM | Admit: 2023-12-08 | Discharge: 2023-12-08 | Disposition: A | Discharge status: Home / Self Care | Payer: PPO | Attending: Emergency Medicine | Admitting: Emergency Medicine

## 2023-12-08 DIAGNOSIS — Z20822 Contact with and (suspected) exposure to covid-19: Secondary | ICD-10-CM | POA: Diagnosis not present

## 2023-12-08 DIAGNOSIS — H1089 Other conjunctivitis: Secondary | ICD-10-CM | POA: Insufficient documentation

## 2023-12-08 DIAGNOSIS — I129 Hypertensive chronic kidney disease with stage 1 through stage 4 chronic kidney disease, or unspecified chronic kidney disease: Secondary | ICD-10-CM | POA: Diagnosis not present

## 2023-12-08 DIAGNOSIS — Z7982 Long term (current) use of aspirin: Secondary | ICD-10-CM | POA: Diagnosis not present

## 2023-12-08 DIAGNOSIS — Z79899 Other long term (current) drug therapy: Secondary | ICD-10-CM | POA: Diagnosis not present

## 2023-12-08 DIAGNOSIS — H109 Unspecified conjunctivitis: Secondary | ICD-10-CM

## 2023-12-08 DIAGNOSIS — J069 Acute upper respiratory infection, unspecified: Secondary | ICD-10-CM

## 2023-12-08 DIAGNOSIS — N189 Chronic kidney disease, unspecified: Secondary | ICD-10-CM | POA: Insufficient documentation

## 2023-12-08 DIAGNOSIS — R059 Cough, unspecified: Secondary | ICD-10-CM | POA: Diagnosis present

## 2023-12-08 MED ORDER — POLYMYXIN B-TRIMETHOPRIM 10000-0.1 UNIT/ML-% OP SOLN
2.0000 [drp] | Freq: Once | OPHTHALMIC | Status: AC
  Administered 2023-12-08: 2 [drp] via OPHTHALMIC
  Filled 2023-12-08: qty 10

## 2023-12-08 MED ORDER — POLYMYXIN B-TRIMETHOPRIM 10000-0.1 UNIT/ML-% OP SOLN
2.0000 [drp] | OPHTHALMIC | 0 refills | Status: DC
Start: 2023-12-08 — End: 2024-07-02

## 2023-12-08 NOTE — Discharge Instructions (Addendum)
 You were seen in the emergency department with concerns of a URI.  Your respiratory panel is currently pending at this time.  I do suspect you likely have bacterial conjunctivitis in the left eye so I have added on a prescription for Polytrim .  Please take this and plan on following up with your primary care provider for repeat evaluation to ensure that her symptoms are improving.  If you have any acute worsening symptoms such as development of chest pain, shortness of breath, or any other concerns, return to the emergency department.

## 2023-12-08 NOTE — ED Notes (Signed)
 Reviewed D/C information with the patient, pt verbalized understanding. No additional concerns at this time.

## 2023-12-08 NOTE — ED Provider Notes (Signed)
 [facility] Provider Note   CSN: [id number] Arrival date & time: 12/08/23  2219     History Chief Complaint  Patient presents with   URI    Brandon Burns is a 78 y.o. male.  Patient past history significant for chronic kidney disease, hypertension, presents to the emergency department with concerns of a URI.  He reports that he has been dealing with a cough, congestion, headache, and right ear congestion for the last 2 days.  Also endorses that his left eye has been somewhat irritated over the last several days and was given a prescription for sulfacetamide for this.  States that he is not getting any improvement in symptoms.  Denies any vision changes or any other acute ocular concerns.  Does report that he has had some crusting of the eye but denies any spread of symptoms to the right eye.   URI Presenting symptoms: cough        Home Medications Prior to Admission medications   Medication Sig Start Date End Date Taking? Authorizing Provider  trimethoprim -polymyxin b  (POLYTRIM ) ophthalmic solution Place 2 drops into the left eye every 4 (four) hours. 12/08/23  Yes Ankit Degregorio A, PA-C  amLODipine  (NORVASC ) 2.5 MG tablet Take 2.5 mg by mouth daily.    [provider]  aspirin  EC 81 MG tablet Take 81 mg by mouth daily. Swallow whole.    [provider]  Brimonidine Tartrate (LUMIFY) 0.025 % SOLN Place 1 drop into both eyes daily.    [provider]  Cholecalciferol (VITAMIN D3) 50 MCG (2000 UT) TABS Take 2,000 Units by mouth daily.    [provider]  omeprazole (PRILOSEC OTC) 20 MG tablet Take 20 mg by mouth daily before breakfast.    [provider]  rosuvastatin  (CRESTOR ) 20 MG tablet Take 1 tablet (20 mg total) by mouth daily. Patient not taking: Reported on 10/13/2023 07/05/23 10/03/23  Acharya, Gayatri A, MD  tamsulosin (FLOMAX) 0.4 MG CAPS capsule Take 1 capsule by mouth daily. 05/29/23    [provider]  tiZANidine  (ZANAFLEX ) 4 MG tablet Take 4 mg by mouth at bedtime.    [provider]  vitamin B-12 (CYANOCOBALAMIN) 500 MCG tablet Take 500 mcg by mouth daily.    [provider]  zolpidem  (AMBIEN ) 10 MG tablet Take 10 mg by mouth at bedtime.    [provider]      Allergies    Patient has no known allergies.    Review of Systems   Review of Systems  Eyes:  Positive for discharge and redness.  Respiratory:  Positive for cough.   All other systems reviewed and are negative.   Physical Exam Updated Vital Signs BP (!) 160/82 (BP Location: Left Arm)   Pulse 66   Temp 97.9 F (36.6 C) (Oral)   Resp 20   Ht 6' 1 (1.854 m)   Wt 102.1 kg   SpO2 98%   BMI 29.69 kg/m  Physical Exam Vitals and nursing note reviewed.  Constitutional:      General: He is not in acute distress.    Appearance: He is well-developed.  HENT:     Head: Normocephalic and atraumatic.  Eyes:     General:        Right eye: No discharge.        Left eye: Discharge present.    Extraocular Movements: Extraocular movements intact.     Right eye: Normal extraocular motion  and no nystagmus.     Left eye: Normal extraocular motion and no nystagmus.     Conjunctiva/sclera:     Right eye: Right conjunctiva is not injected. No chemosis or exudate.    Left eye: Left conjunctiva is injected. Exudate present. No chemosis or hemorrhage.    Pupils: Pupils are equal, round, and reactive to light.     Comments: Left eye   Cardiovascular:     Rate and Rhythm: Normal rate and regular rhythm.     Heart sounds: No murmur heard. Pulmonary:     Effort: Pulmonary effort is normal. No respiratory distress.     Breath sounds: Normal breath sounds. No wheezing, rhonchi or rales.  Abdominal:     Palpations: Abdomen is soft.     Tenderness: There is no abdominal tenderness.  Musculoskeletal:        General: No swelling.     Cervical back: Neck supple.  Skin:    General:  Skin is warm and dry.     Capillary Refill: Capillary refill takes less than 2 seconds.  Neurological:     Mental Status: He is alert.  Psychiatric:        Mood and Affect: Mood normal.     ED Results / Procedures / Treatments   Labs (all labs ordered are listed, but only abnormal results are displayed) Labs Reviewed  RESP PANEL BY RT-PCR (RSV, FLU A&B, COVID)  [id number]    EKG None  Radiology No results found.  Procedures Procedures   Medications Ordered in ED Medications  trimethoprim -polymyxin b  (POLYTRIM ) ophthalmic solution 2 drop (2 drops Right Eye Given 12/08/23 2254)    ED Course/ Medical Decision Making/ A&P                                 Medical Decision Making Risk Prescription drug management.   This patient presents to the ED for concern of URI.  Differential diagnosis includes COVID-19, influenza, pneumonia, bacterial conjunctivitis, viral conjunctivitis   Lab Tests:  I Ordered, and personally interpreted labs.  The pertinent results include: Respiratory panel pending    Medicines ordered and prescription drug management:  I ordered medication including Polytrim  for conjunctivitis Reevaluation of the patient after these medicines showed that the patient stayed the same I have reviewed the patients home medicines and have made adjustments as needed   Problem List / ED Course:  Patient with past history significant for CKD, hypertension presents the emergency department concerns of a URI.  He reports he has been dealing with a cough, congestion, headache, and right ear ear congestion for the last 2 days.  He is also endorsing some left eye discomfort and some redness.  States that he was seen by his PCP and was given a prescription for sulfacetamide for the eye irritation but has not had any improvement after the last few days of use of the drops.  He denies any vision changes or any other acute ocular concerns such as flashes of light, floaters, or  eye pain. On my exam, patient is very well-appearing with no abnormal heart sounds, no abnormal lung sounds, and appears to be otherwise stable with no abnormal vital seen except for some hypertension.  He denies any recent fever chills or bodyaches so I suspect patient may be developing a sinus infection given his congestion and headache as well as right ear fullness with some eye irritation although this all  may be part of a viral process.  A respiratory panel has been ordered for assessment of the symptoms although patient does not appear to have any sort of severe URI such as flu or COVID.  I suspect if he does have a URI, this is more mild in nature.  He does however does appear to have conjunctivitis of the left eye and concern for bacterial gingivitis given crusting and discharge that has been present.  Will switch patient to Polytrim  for attempted management of his symptoms.  Discussed return precautions.  Given the patient is otherwise well-appearing with no abnormal findings on physical exam, believe that patient is safe for outpatient follow-up with PCP with continued use of antibiotic drops for his bacterial conjunctivitis.  Discussed return precautions such as progression to shortness of breath, chest pain, or any acute or worsening symptoms.  No other acute or focal concerns at this time.  Otherwise stable and discharged home for PCP follow-up.   Final Clinical Impression(s) / ED Diagnoses Final diagnoses:  Bacterial conjunctivitis of left eye  Viral URI    Rx / DC Orders ED Discharge Orders          Ordered    trimethoprim -polymyxin b  (POLYTRIM ) ophthalmic solution  Every 4 hours        12/08/23 2326              Khallid Pasillas A, PA-C 12/09/23 0028    Francesca Elsie CROME, MD 12/09/23 1520

## 2023-12-08 NOTE — ED Triage Notes (Addendum)
 URI with cough, congestion, headache, right ear congestion, X 2 days. Also left eye issue was seen at PCP and given drops. Not getting better.

## 2023-12-09 LAB — RESP PANEL BY RT-PCR (RSV, FLU A&B, COVID)  RVPGX2
Influenza A by PCR: NEGATIVE
Influenza B by PCR: NEGATIVE
Resp Syncytial Virus by PCR: NEGATIVE
SARS Coronavirus 2 by RT PCR: NEGATIVE

## 2023-12-29 ENCOUNTER — Ambulatory Visit: Payer: PPO

## 2023-12-29 ENCOUNTER — Ambulatory Visit: Payer: PPO | Attending: Internal Medicine | Admitting: Internal Medicine

## 2023-12-29 ENCOUNTER — Encounter: Payer: Self-pay | Admitting: Internal Medicine

## 2023-12-29 VITALS — BP 115/72 | HR 58 | Ht 73.0 in | Wt 225.4 lb

## 2023-12-29 DIAGNOSIS — E782 Mixed hyperlipidemia: Secondary | ICD-10-CM | POA: Diagnosis not present

## 2023-12-29 DIAGNOSIS — I700 Atherosclerosis of aorta: Secondary | ICD-10-CM | POA: Diagnosis not present

## 2023-12-29 DIAGNOSIS — R072 Precordial pain: Secondary | ICD-10-CM

## 2023-12-29 DIAGNOSIS — E7800 Pure hypercholesterolemia, unspecified: Secondary | ICD-10-CM

## 2023-12-29 DIAGNOSIS — I10 Essential (primary) hypertension: Secondary | ICD-10-CM

## 2023-12-29 DIAGNOSIS — Z79899 Other long term (current) drug therapy: Secondary | ICD-10-CM

## 2023-12-29 MED ORDER — EZETIMIBE 10 MG PO TABS: 10.0000 mg | ORAL_TABLET | Freq: Every day | ORAL | 3 refills | Status: DC

## 2023-12-29 NOTE — Progress Notes (Signed)
  Cardiology Office Note:  .   Date:  12/29/2023  ID:  Brandon Burns, DOB [id number], MRN [id number] PCP: Loyal Jacobson, MD  [facility] Providers Cardiologist:  Parke Poisson, MD    History of Present Illness: [name] Kitchen   Brandon Burns is a 78 y.o. male.  Discussed the use of AI scribe software for clinical note transcription with the patient, who gave verbal consent to proceed.  History of Present Illness   The patient, a 78 year old with a history of minimal CAD, high cholesterol, and high blood pressure, presents for a follow-up visit. The patient recently underwent a heart catheterization which showed very mild blockages. The patient has been experiencing chest discomfort, which has been infrequent since the catheterization. The patient has been off cholesterol medication due to muscle aches. The patient is also due to get married soon, in June.        ROS: negative except per HPI above.  Studies Reviewed: .        Results   LABS LDL: 123 mg/dL Triglycerides: 956 mg/dL  DIAGNOSTIC Cardiac catheterization: Very mild blockages, no significant stenosis, minimal plaque (10/2023) Echocardiogram: Normal (06/2023)     Risk Assessment/Calculations:             Physical Exam:   VS:  BP 115/72   Pulse (!) 58   Ht 6\' 1"  (1.854 m)   Wt 225 lb 6.4 oz (102.2 kg)   SpO2 98%   BMI 29.74 kg/m    Wt Readings from Last 3 Encounters:  12/29/23 225 lb 6.4 oz (102.2 kg)  12/08/23 225 lb (102.1 kg)  10/19/23 223 lb (101.2 kg)     Physical Exam   VITALS: BP- 115/72     GEN: Well nourished, well developed in no acute distress NECK: No JVD; No carotid bruits CARDIAC: RRR, no murmurs, rubs, gallops RESPIRATORY:  Clear to auscultation without rales, wheezing or rhonchi  ABDOMEN: Soft, non-tender, non-distended EXTREMITIES:  No edema; No deformity   ASSESSMENT AND PLAN: .    Assessment & Plan Mixed hyperlipidemia  Elevated LDL cholesterol level  Precordial  pain  Aortic atherosclerosis (HCC)  Essential (primary) hypertension   Assessment and Plan    Chest Pain Mild discomfort reported once since last visit in November. Recent heart catheterization showed minimal blockages (20% RCA and LAD). Echocardiogram in July was normal. -Continue current management.  Hypertension Blood pressure well controlled on Amlodipine 2.5mg  daily. -Continue Amlodipine 2.5mg  daily.  Hyperlipidemia LDL 123 (goal <70), Triglycerides 205 (goal <150). Patient previously discontinued Rosuvastatin due to muscle aches. -Start Zetia 10mg  daily. -Check cholesterol levels in 3 months.  General Health Maintenance / Followup Plans -Return visit in 6 months.

## 2023-12-29 NOTE — Patient Instructions (Signed)
Medication Instructions:  Start: Ezetimibe (Zetia) 10 mg once daily *If you need a refill on your cardiac medications before your next appointment, please call your pharmacy*  Lab Work: None  Testing/Procedures: None  Follow-Up: At [facility], you and your health needs are our priority.  As part of our continuing mission to provide you with exceptional heart care, we have created designated Provider Care Teams.  These Care Teams include your primary Cardiologist (physician) and Advanced Practice Providers (APPs -  Physician Assistants and Nurse Practitioners) who all work together to provide you with the care you need, when you need it.  Your next appointment:   6 month(s)  Provider:   Parke Poisson, MD

## 2024-01-03 ENCOUNTER — Ambulatory Visit: Payer: PPO | Admitting: Internal Medicine

## 2024-04-17 ENCOUNTER — Emergency Department (HOSPITAL_BASED_OUTPATIENT_CLINIC_OR_DEPARTMENT_OTHER)

## 2024-04-17 ENCOUNTER — Other Ambulatory Visit: Payer: Self-pay

## 2024-04-17 ENCOUNTER — Emergency Department (HOSPITAL_BASED_OUTPATIENT_CLINIC_OR_DEPARTMENT_OTHER)
Admission: EM | Admit: 2024-04-17 | Discharge: 2024-04-17 | Disposition: A | Discharge status: Home / Self Care | Attending: Emergency Medicine | Admitting: Emergency Medicine

## 2024-04-17 ENCOUNTER — Encounter (HOSPITAL_BASED_OUTPATIENT_CLINIC_OR_DEPARTMENT_OTHER): Payer: Self-pay | Admitting: Emergency Medicine

## 2024-04-17 DIAGNOSIS — S161XXA Strain of muscle, fascia and tendon at neck level, initial encounter: Secondary | ICD-10-CM | POA: Diagnosis not present

## 2024-04-17 DIAGNOSIS — R0789 Other chest pain: Secondary | ICD-10-CM | POA: Diagnosis not present

## 2024-04-17 DIAGNOSIS — S199XXA Unspecified injury of neck, initial encounter: Secondary | ICD-10-CM | POA: Diagnosis present

## 2024-04-17 DIAGNOSIS — N189 Chronic kidney disease, unspecified: Secondary | ICD-10-CM | POA: Insufficient documentation

## 2024-04-17 DIAGNOSIS — I129 Hypertensive chronic kidney disease with stage 1 through stage 4 chronic kidney disease, or unspecified chronic kidney disease: Secondary | ICD-10-CM | POA: Diagnosis not present

## 2024-04-17 DIAGNOSIS — Y92410 Unspecified street and highway as the place of occurrence of the external cause: Secondary | ICD-10-CM | POA: Insufficient documentation

## 2024-04-17 DIAGNOSIS — R202 Paresthesia of skin: Secondary | ICD-10-CM | POA: Insufficient documentation

## 2024-04-17 DIAGNOSIS — Z79899 Other long term (current) drug therapy: Secondary | ICD-10-CM | POA: Diagnosis not present

## 2024-04-17 DIAGNOSIS — Z7982 Long term (current) use of aspirin: Secondary | ICD-10-CM | POA: Insufficient documentation

## 2024-04-17 MED ORDER — OXYCODONE-ACETAMINOPHEN 5-325 MG PO TABS
1.0000 | ORAL_TABLET | ORAL | Status: DC | PRN
  Administered 2024-04-17: 1 via ORAL
  Filled 2024-04-17: qty 1

## 2024-04-17 MED ORDER — OXYCODONE HCL 5 MG PO TABS: 5.0000 mg | ORAL_TABLET | Freq: Four times a day (QID) | ORAL | 0 refills | Status: DC | PRN

## 2024-04-17 NOTE — ED Notes (Signed)
 Patient transported to CT
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
## 2024-05-05 IMAGING — CT CT ABD-PELV W/ CM
2 of 5 series · 16 of 46 positions shown, 18 images · IV contrast (Omnipaque)
Comparison: CT renal stone 08/30/2021

CLINICAL DATA: Dysuria, fever and back pain.

EXAM:
CT ABDOMEN AND PELVIS WITH CONTRAST
TECHNIQUE: Multidetector CT imaging of the abdomen and pelvis was performed
using the standard protocol following bolus administration of
intravenous contrast.

[Series 2: axial st · axial · 0.84mm/px · z∈[-546,-122]mm · 13 of 96 slices shown, 15 images]
[im 6/96  soft-tissue]
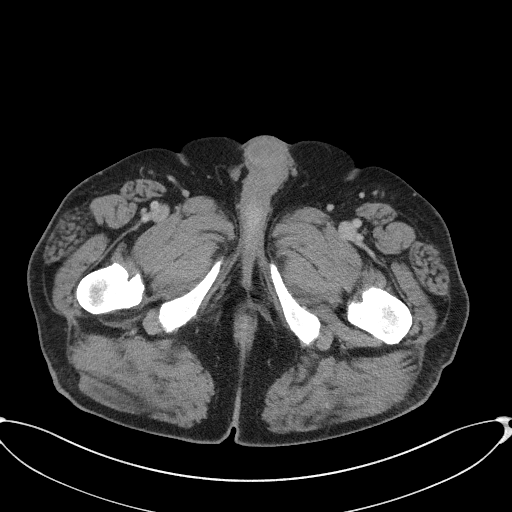
[im 6/96  bone]
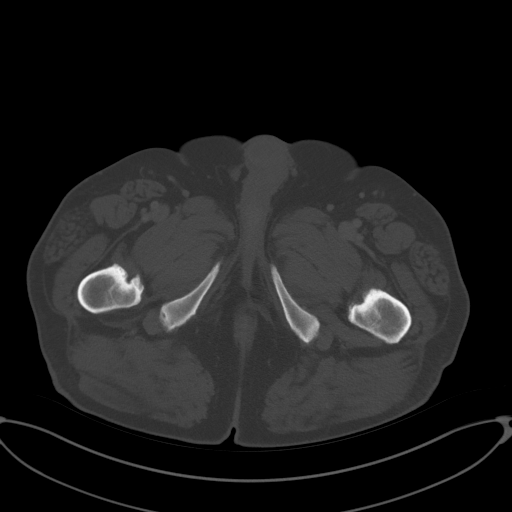
[im 16/96  soft-tissue]
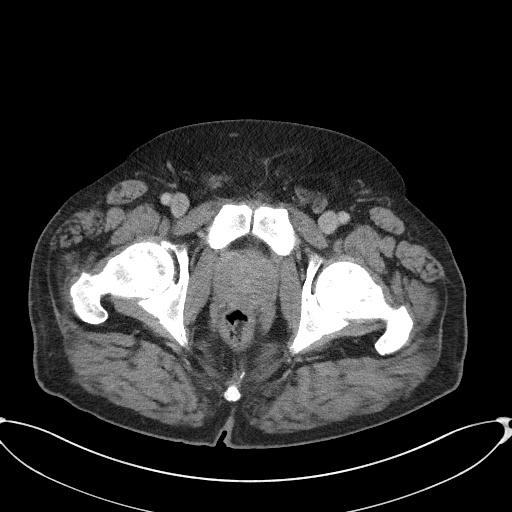
[im 21/96  soft-tissue]
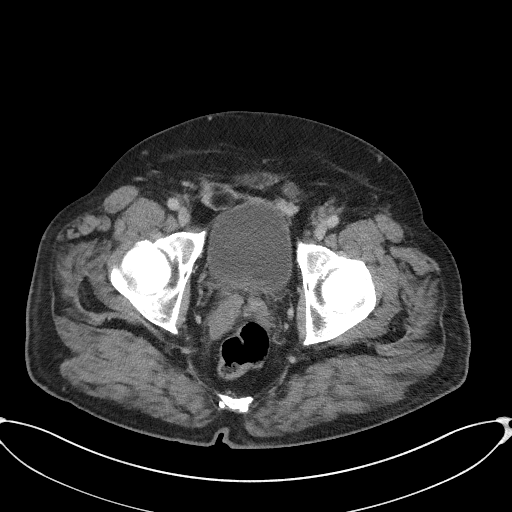
[im 26/96  soft-tissue]
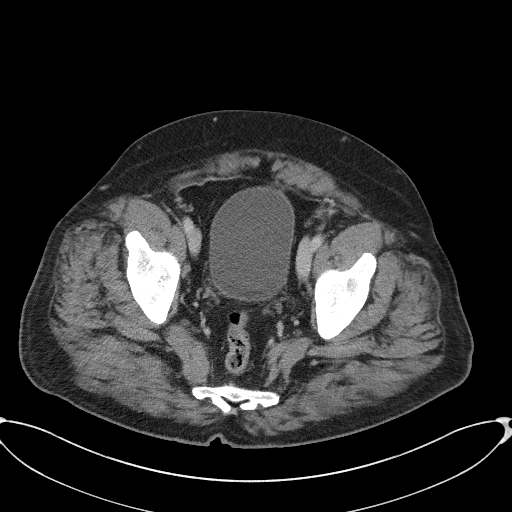
[im 36/96  soft-tissue]
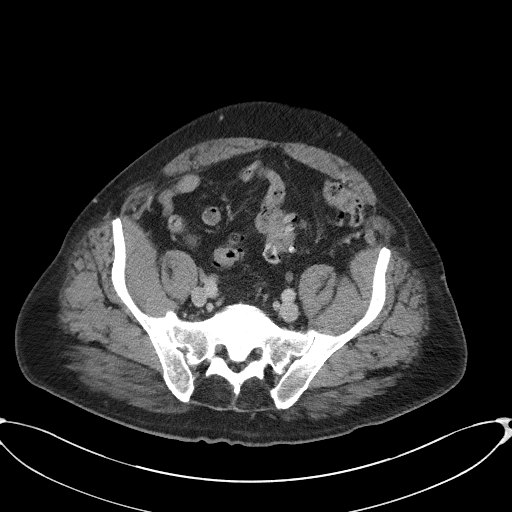
[im 41/96  soft-tissue]
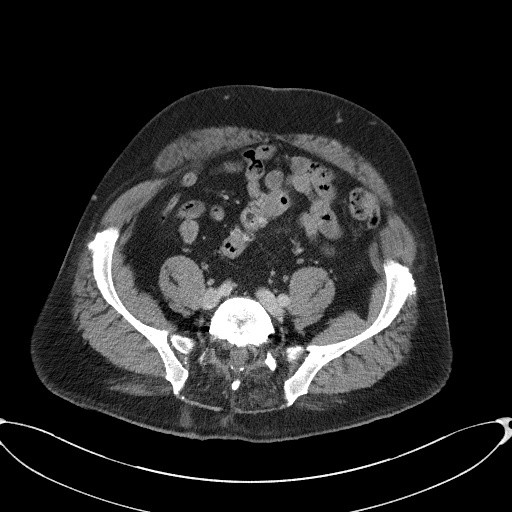
[im 51/96  soft-tissue]
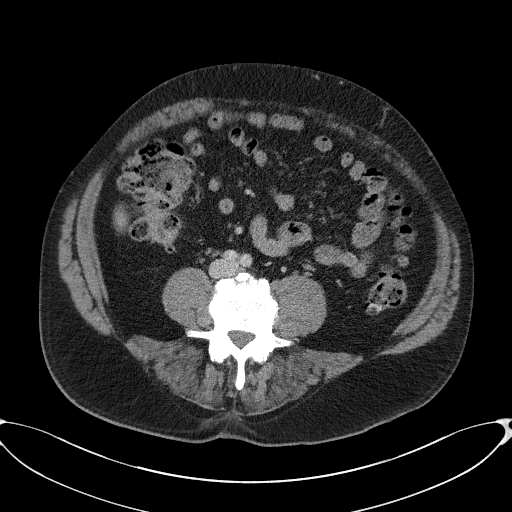
[im 56/96  soft-tissue]
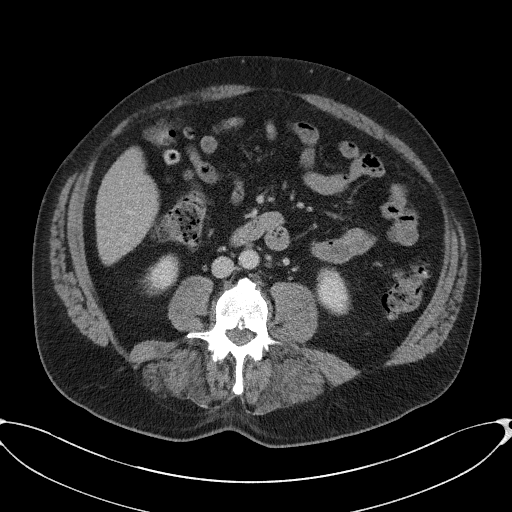
[im 61/96  soft-tissue]
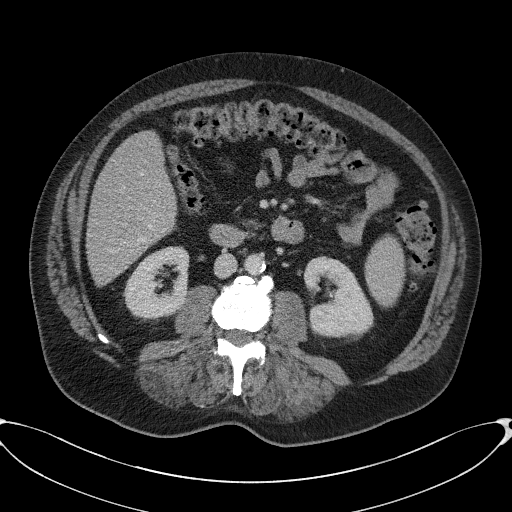
[im 61/96  bone]
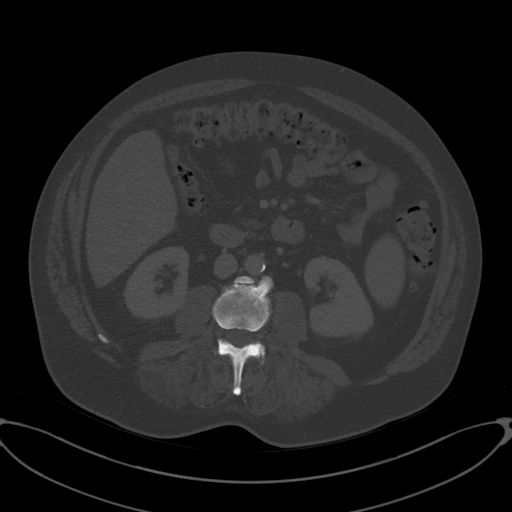
[im 71/96  soft-tissue]
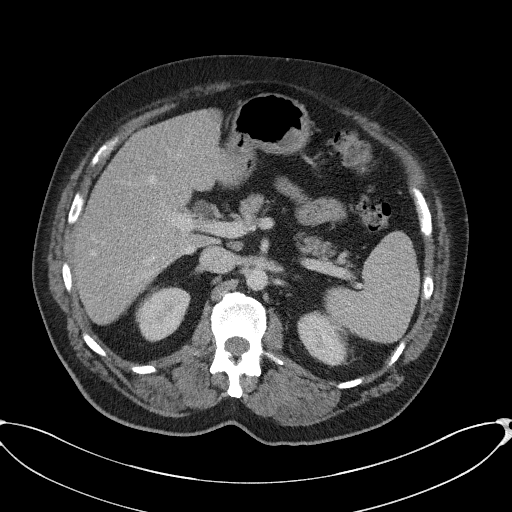
[im 76/96  soft-tissue]
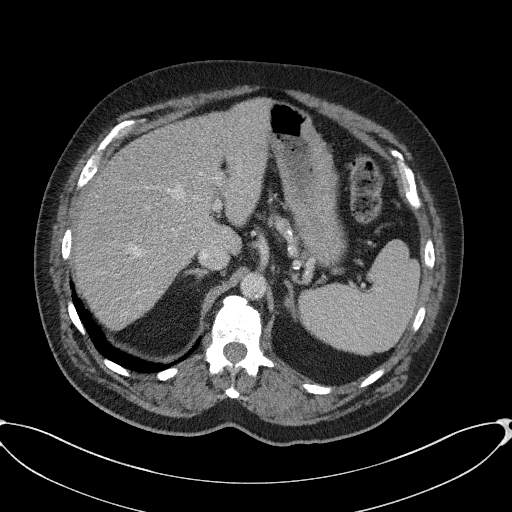
[im 81/96  soft-tissue]
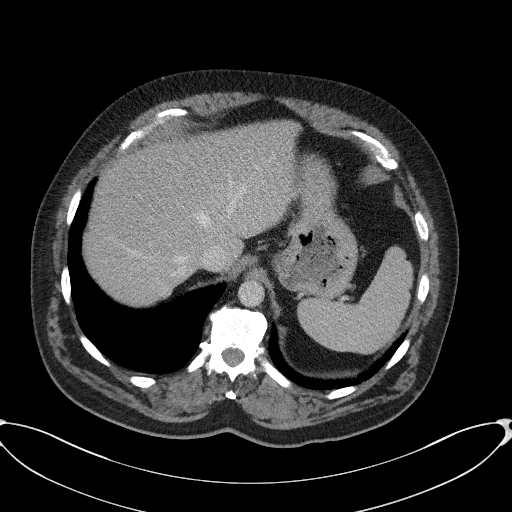
[im 91/96  soft-tissue]
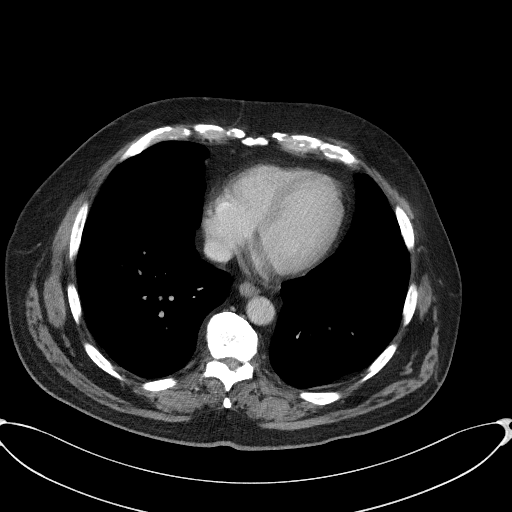

[Series 6: coronal st · coronal · 0.80mm/px · 3 of 124 slices shown]
[im 42/124  soft-tissue]
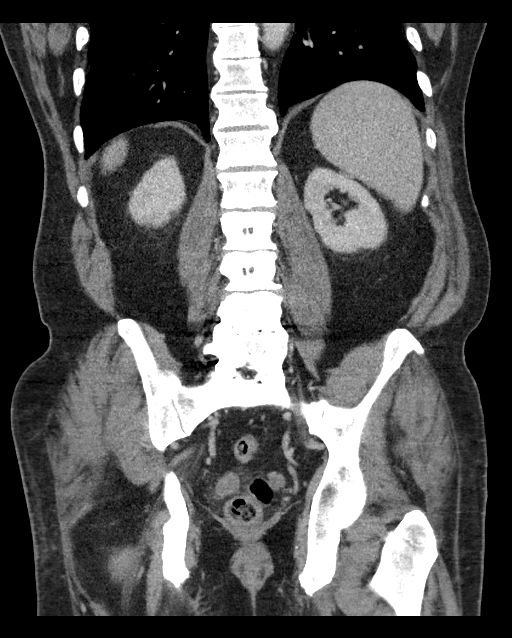
[im 55/124  soft-tissue]
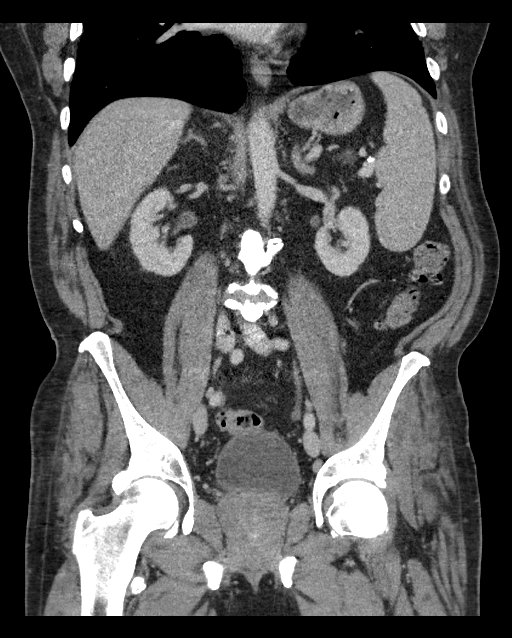
[im 69/124  soft-tissue]
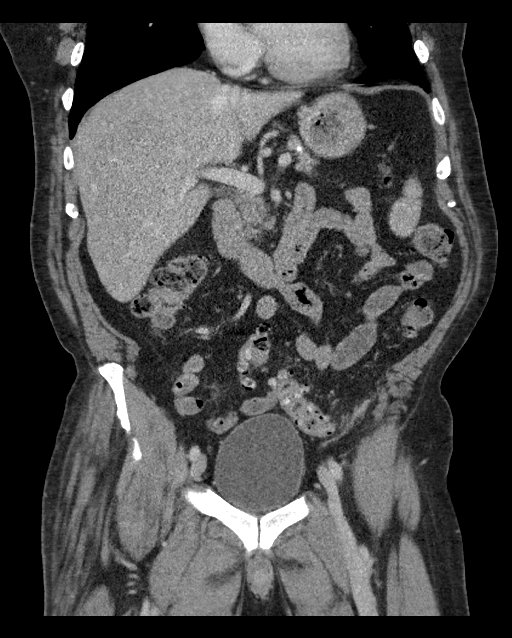

[16 of 46 positions shown; findings below may reference images not displayed]

RADIATION DOSE REDUCTION: This exam was performed according to the
departmental dose-optimization program which includes automated
exposure control, adjustment of the mA and/or kV according to
patient size and/or use of iterative reconstruction technique.

CONTRAST:  100mL OMNIPAQUE IOHEXOL 300 MG/ML  SOLN
FINDINGS: Lower chest: No acute abnormality.

Hepatobiliary: No focal liver abnormality is seen. Status post
cholecystectomy. No biliary dilatation.

Pancreas: Unremarkable. No pancreatic ductal dilatation or
surrounding inflammatory changes.

Spleen: Mildly enlarged, increased in size compared to the prior
study.

Adrenals/Urinary Tract: Adrenal glands are unremarkable. Kidneys are
normal, without renal calculi, focal lesion, or hydronephrosis.
Bladder is unremarkable.

Stomach/Bowel: Stomach is within normal limits. Appendix appears
normal. No evidence of bowel wall thickening, distention, or
inflammatory changes. There is diffuse colonic diverticulosis
without evidence for acute diverticulitis.

Vascular/Lymphatic: Aortic atherosclerosis. No enlarged abdominal or
pelvic lymph nodes.

Reproductive: Prostate gland is enlarged.

Other: No abdominal wall hernia or abnormality. No abdominopelvic
ascites.

Musculoskeletal: Disc spacers are seen at L4-L5 and L5-S1.
Multilevel degenerative changes affect the spine.
IMPRESSION: 1. No acute process in the abdomen or pelvis.
2. Diffuse colonic diverticulosis without evidence for acute
diverticulitis.
3. Mild splenomegaly, increased from prior.  Correlate clinically.
4.  Aortic Atherosclerosis ([id number]-ZWK.K).

## 2024-05-23 NOTE — Progress Notes (Signed)
 Payor: [employer] [place] / Plan: [employer] [place] / Product Type: Medicare Advantage /   History of Present Illness   History of Present Illness The patient presents for a chronic follow-up visit.  He has been taking Ambien  for several years to manage his sleep issues. He acknowledges its dependency and potential link to Alzheimer's disease. He is considering discontinuing the medication after his retirement in 02/2025. Additionally, he takes a muscle relaxant at night. He has been diagnosed with sleep apnea but does not use a CPAP machine due to discomfort, despite trying it for about 3 months. He is interested in exploring the Confluence device as a potential treatment option.  He reports a history of erectile dysfunction (ED) and has previously consulted with a urologist. He has tried Cialis and Viagra, but these medications have not been effective.  He is requesting a referral for  colonoscopy in 06/2024 and an endoscopy to address esophageal issues. He had 8 polyps removed during his last colonoscopy 3 years ago.   Review of Systems  Gastrointestinal:        Mild dysphagia  Genitourinary:        Erectile dysfunction  Psychiatric/Behavioral:  Positive for sleep disturbance.   All other systems reviewed and are negative.   Past Contributory History   Current Medications[1] Allergies[2] Medical History[3] Surgical History[4] Family History[5]    Physical Exam   Vitals:   05/23/24 1046  BP: 127/69  Pulse: 60  SpO2: 95%  Weight: 101 kg (223 lb 3.2 oz)  Height: 1.854 m (6' 1)   Physical Exam Vitals and nursing note reviewed.  Constitutional:      General: He is not in acute distress.    Appearance: Normal appearance. He is normal weight. He is not ill-appearing, toxic-appearing or diaphoretic.  HENT:     Head: Normocephalic and atraumatic.     Right Ear: External ear normal.     Left Ear: External ear normal.     Nose: Nose normal.     Mouth/Throat:      Lips: Pink.     Mouth: Mucous membranes are moist.   Eyes:     Conjunctiva/sclera: Conjunctivae normal.     Pupils: Pupils are equal, round, and reactive to light.    Cardiovascular:     Rate and Rhythm: Normal rate and regular rhythm.  Pulmonary:     Effort: Pulmonary effort is normal. No respiratory distress.     Breath sounds: Normal breath sounds. No stridor. No wheezing, rhonchi or rales.  Chest:     Chest wall: No tenderness.   Musculoskeletal:        General: Normal range of motion.     Cervical back: Normal range of motion and neck supple.   Skin:    General: Skin is warm.   Neurological:     General: No focal deficit present.     Mental Status: He is alert and oriented to person, place, and time.   Psychiatric:        Mood and Affect: Mood normal.        Behavior: Behavior is cooperative.     Procedures   Procedures  Results   Lab results: No results found for this or any previous visit (from the past 24 hours).  X-ray results: Radiology Results (last 7 days)     ** No results found for the last 168 hours. **       Diagnosis & Disposition   1. Insomnia  with sleep apnea  tiZANidine  (ZANAFLEX ) 4 mg tablet   zolpidem  (AMBIEN ) 10 mg tablet    2. Sleep apnea, unspecified type  Ambulatory referral to ENT    3. Screening for colon cancer  Ambulatory referral to Gastroenterology    4. Benign essential HTN  amLODIPine (NORVASC) 2.5 mg tablet    5. Chronic kidney disease, stage 3b (CMD)        Assessment & Plan 1. Chronic insomnia. - He is tolerating the Ambien  well. - Advised to consider reducing the dosage of Ambien  by half to see if there is any noticeable difference in his symptoms. - Alternative sleep aids were discussed, including the potential use of a muscle relaxer at night. - A prescription for Ambien  will be sent to his pharmacy.  2. Erectile dysfunction. - He has experienced limited success with Cialis and Viagra.  Advised patient  that long-term use of Ambien  can contribute to ED, but a larger contributor would be unmanaged/poorly managed sleep apnea.  He was requesting a referral to ENT to discuss pros versus cons of inspire device versus mouthpiece.  Referral to ENT has been placed as requested.  3. Health maintenance. - We will place referral for colonoscopy/EGD. - Will obtain blood work today and update him of the results. - A referral to GI will be made for the colonoscopy and EGD.  4.  Hypertension -Tolerating amlodipine well.  Will refill today.  5. CKD 3B - Will check lab work to monitor today.  Counseled regarding condition(s) and all patient questions answered. If a new prescription was given today, then I discussed potential side effects, drug interactions, and instructions for taking the medication. Reviewed worrisome signs and symptoms to watch for and instructed patient to seek medical attention for any worsening, prolonged, changing, or new symptoms. I have discussed the signs and symptoms that would warrant proceeding to an emergency department.  All questions have been answered and the patient has voiced understanding and agreement with the plan of care.   Return for Follow-up in 6 months for Medicare wellness/chronic follow-up.  There are no Patient Instructions on file for this visit.   This documented history was created with the aid of Dragon dictation software as well as [employer], an automated transcribing artificial intelligence program, with light post-production editing.       [1] Current Outpatient Medications  Medication Sig Dispense Refill  . brimonidine (Lumify) 0.025 % drop Administer 1 drop into each eyes 2 (two) times a day.    . cholecalciferol (VITAMIN D3) 2,000 unit cap capsule Take 2,000 Units by mouth Once Daily.    . cyanocobalamin (VITAMIN B12) 500 mcg tablet Take 500 mcg by mouth Once Daily.    SABRA omeprazole (PriLOSEC) 20 mg DR capsule Take 1 capsule (20 mg total) by  mouth daily. 90 capsule 3  . tamsulosin (FLOMAX) 0.4 mg cap Take 1 capsule (0.4 mg total) by mouth daily. 90 capsule 3  . amLODIPine (NORVASC) 2.5 mg tablet Take 1 tablet (2.5 mg total) by mouth daily. 90 tablet 1  . tiZANidine  (ZANAFLEX ) 4 mg tablet TAKE 1/2-1 TABLET (2-4 MG TOTAL) BY MOUTH NIGHTLY 90 tablet 1  . zolpidem  (AMBIEN ) 10 mg tablet TAKE 0.5-1 TABLETS BY MOUTH NIGHTLY AS NEEDED FOR SLEEP 30 tablet 5   No current facility-administered medications for this visit.  [2] Allergies Allergen Reactions  . Atorvastatin Myalgias  . Pravastatin Myalgias  . Rosuvastatin  Myalgias  [3] Past Medical History: Diagnosis Date  . Arthritis   .  Calculus of kidney   . Chronic kidney disease    Mild  . GERD (gastroesophageal reflux disease)   . Hypertension    controlled with medications  . Insomnia   . Sleep apnea    patient refuses C-Pap  . Stomach acid   . Urinary tract infection   [4] Past Surgical History: Procedure Laterality Date  . BACK SURGERY     Procedure: BACK SURGERY  . BONE CYST EXCISION Right 01/26/2024   EXCISION BONE SPUR performed by Dekarlos Megail Dial, DPM at [facility] OR  . CARDIAC CATHETERIZATION  12/2023   no concerns / everything great  . CHOLECYSTECTOMY     Procedure: CHOLECYSTECTOMY  . HEMORROIDECTOMY     Procedure: HEMORROIDECTOMY  . HERNIA REPAIR  April 2015   Procedure: HERNIA REPAIR  . SPINE SURGERY    . TONSILLECTOMY     Procedure: TONSILLECTOMY  . TOTAL KNEE ARTHROPLASTY Right 11/08/2016   Procedure: TOTAL KNEE ARTHROPLASTY;  Surgeon: Vaughn Carlin Eck, MD;  Location: [facility] MAIN OR;  Service: Orthopedics;  Laterality: Right;  . TOTAL KNEE ARTHROPLASTY Left 12/13/2021   Procedure: TOTAL KNEE ARTHROPLASTY;  Surgeon: Vaughn Carlin Eck, MD;  Location: [facility] MAIN OR;  Service: Orthopedics;  Laterality: Left;  [5] Family History Problem Relation Name Age of Onset  . Stroke Mother Tilman Mcclaren   . Hyperlipidemia Mother Zahari Xiang   . Stroke  Paternal Grandfather Lemunel Tortora   . Cancer Father Aloysious Vangieson [facility])        Lung  . Cancer Brother Maxime Beckner        Liver  . Diabetes Neg Hx    . Hypertension Neg Hx    . Prostate cancer Neg Hx    . Colon cancer Neg Hx    . Aortic aneurysm Neg Hx

## 2024-06-29 LAB — LIPID PANEL
Chol/HDL Ratio: 5.2 ratio — ABNORMAL HIGH (ref 0.0–5.0)
Cholesterol, Total: 191 mg/dL (ref 100–199)
HDL: 37 mg/dL — ABNORMAL LOW (ref >39)
LDL Chol Calc (NIH): 108 mg/dL — ABNORMAL HIGH (ref 0–99)
Triglycerides: 266 mg/dL — ABNORMAL HIGH (ref 0–149)
VLDL Cholesterol Cal: 46 mg/dL — ABNORMAL HIGH (ref 5–40)

## 2024-06-29 LAB — HEPATIC FUNCTION PANEL
ALT: 18 IU/L (ref 0–44)
AST: 19 IU/L (ref 0–40)
Albumin: 4.2 g/dL (ref 3.8–4.8)
Alkaline Phosphatase: 91 IU/L (ref 44–121)
Bilirubin Total: 0.3 mg/dL (ref 0.0–1.2)
Bilirubin, Direct: 0.13 mg/dL (ref 0.00–0.40)
Total Protein: 7.1 g/dL (ref 6.0–8.5)

## 2024-07-02 ENCOUNTER — Other Ambulatory Visit (HOSPITAL_COMMUNITY): Payer: Self-pay

## 2024-07-02 ENCOUNTER — Ambulatory Visit: Attending: Internal Medicine | Admitting: Internal Medicine

## 2024-07-02 ENCOUNTER — Encounter: Payer: Self-pay | Admitting: Internal Medicine

## 2024-07-02 VITALS — BP 120/70 | HR 60 | Ht 73.0 in | Wt 224.0 lb

## 2024-07-02 DIAGNOSIS — I872 Venous insufficiency (chronic) (peripheral): Secondary | ICD-10-CM

## 2024-07-02 DIAGNOSIS — R072 Precordial pain: Secondary | ICD-10-CM | POA: Diagnosis not present

## 2024-07-02 DIAGNOSIS — G4733 Obstructive sleep apnea (adult) (pediatric): Secondary | ICD-10-CM | POA: Diagnosis not present

## 2024-07-02 DIAGNOSIS — I25118 Atherosclerotic heart disease of native coronary artery with other forms of angina pectoris: Secondary | ICD-10-CM

## 2024-07-02 DIAGNOSIS — E782 Mixed hyperlipidemia: Secondary | ICD-10-CM

## 2024-07-02 DIAGNOSIS — I700 Atherosclerosis of aorta: Secondary | ICD-10-CM

## 2024-07-02 DIAGNOSIS — T466X5D Adverse effect of antihyperlipidemic and antiarteriosclerotic drugs, subsequent encounter: Secondary | ICD-10-CM | POA: Diagnosis not present

## 2024-07-02 DIAGNOSIS — M791 Myalgia, unspecified site: Secondary | ICD-10-CM | POA: Diagnosis not present

## 2024-07-02 DIAGNOSIS — Z79899 Other long term (current) drug therapy: Secondary | ICD-10-CM | POA: Diagnosis not present

## 2024-07-02 DIAGNOSIS — E7800 Pure hypercholesterolemia, unspecified: Secondary | ICD-10-CM

## 2024-07-02 MED ORDER — EZETIMIBE 10 MG PO TABS
10.0000 mg | ORAL_TABLET | Freq: Every day | ORAL | 3 refills | Status: DC
  Filled 2024-07-02: qty 90, 90d supply, fill #0

## 2024-07-02 NOTE — Patient Instructions (Signed)
 Medication Instructions:  Please continue the Ezetimibe  (Zetia ) 10 mg one tablet, once daily; Prescription sent to [facility] on [address] in this building.  Lab Work: FASTING Lipid Panel to do in about 6 months (due around 01/02/2025); you can go to any [facility] or back to our building on [address]; no appointment needed; okay to have water and/or black coffee only after midnight before having labs drawn.   Follow-Up: At [facility], you and your health needs are our priority.  As part of our continuing mission to provide you with exceptional heart care, our providers are all part of one team.  This team includes your primary Cardiologist (physician) and Advanced Practice Providers or APPs (Physician Assistants and Nurse Practitioners) who all work together to provide you with the care you need, when you need it.  Your next appointment:   1 year(s)  Provider:   Gayatri A Acharya, MD    Other Instructions Please call us  or send a MyChart message with any Cardiology related questions/concerns.  [phone].  Thank you!

## 2024-07-02 NOTE — Progress Notes (Addendum)
 Cardiology Office Note:  .   Date:  07/02/2024  ID:  Brandon Burns, DOB 04/03/1946, MRN [id number] PCP: Millicent Sharper, MD  [facility] Providers Cardiologist:  Soyla DELENA Merck, MD    History of Present Illness: Brandon   NOLON Burns is a 78 y.o. male.  Discussed the use of AI scribe software for clinical note transcription with the patient, who gave verbal consent to proceed.  History of Present Illness Brandon Burns is a 78 year old male with minimal coronary artery disease, hyperlipidemia, and elevated blood pressure who presents for follow-up of his cardiovascular conditions.  He experiences discomfort under his arm when walking on inclines, but not during golf, and this does not limit his activities. He recalls a cardiac catheterization and PET scan showing mild coronary blockages and slightly low total blood flow suggesting possible microvascular dysfunction.  He has hyperlipidemia and statin intolerance due to myalgias, with muscle aches from rosuvastatin  and non-adherence to Zetia  10 mg daily. Recent labs show LDL at 108 mg/dL, down from 876 mg/dL, and triglycerides at 733 mg/dL.  He takes amlodipine 2.5 mg daily and a baby aspirin . He initially reported not taking amlodipine but later confirmed adherence.  He experiences fatigue and has sleep apnea but does not use a CPAP machine due to discomfort and claustrophobia. He has been taking Ambien  for 20 years to aid sleep and wants to discontinue it. He snores at night and his wife notes he stops breathing during sleep.    ROS: negative except per HPI above.  Studies Reviewed: .        Results LABS LDL: 108 Triglycerides: 266  RADIOLOGY PET scan: Mildly abnormal total blood flow, images normal  DIAGNOSTIC REPORTS Cardiac catheterization: Mild coronary artery blockages Risk Assessment/Calculations:       Physical Exam:   VS:  BP 120/70 (BP Location: Left Arm, Patient Position: Sitting, Cuff Size: Large)    Pulse 60   Ht 6' 1 (1.854 m)   Wt 224 lb (101.6 kg)   SpO2 98%   BMI 29.55 kg/m    Wt Readings from Last 3 Encounters:  07/02/24 224 lb (101.6 kg)  04/17/24 225 lb (102.1 kg)  12/29/23 225 lb 6.4 oz (102.2 kg)     Physical Exam GENERAL: Alert, cooperative, well developed, no acute distress HEENT: Normocephalic, normal oropharynx, moist mucous membranes CHEST: Clear to auscultation bilaterally, no wheezes, rhonchi, or crackles CARDIOVASCULAR: Normal heart rate and rhythm, S1 and S2 normal without murmurs ABDOMEN: Soft, non-tender, non-distended, without organomegaly, normal bowel sounds EXTREMITIES: Mild ankle swelling, right worse than left, no cyanosis NEUROLOGICAL: Cranial nerves grossly intact, moves all extremities without gross motor or sensory deficit   ASSESSMENT AND PLAN: .    Assessment and Plan Assessment & Plan Coronary artery disease with microvascular dysfunction, presenting as stable anginal equivalent of arm pain. Intermittent arm discomfort likely due to microvascular dysfunction. PET scan showed mildly abnormal blood flow; coronary vessels normal on catheterization. - Continue amlodipine 2.5 mg daily. - Increase amlodipine dose if arm discomfort worsens.  Mixed hyperlipidemia with statin intolerance and nonadherence to therapy Statin myalgias Med mgmt Mixed hyperlipidemia with elevated LDL and triglycerides. Statin intolerance due to myalgias. Discussed Zetia  as a non-statin option. Agreed to try Zetia . - Call in Zetia  10 mg daily prescription - Reassess cholesterol levels in 6 months.  Aortic atherosclerosis Presence of aortic atherosclerosis. Risk modification necessary to prevent further plaque buildup.  Hypertension, well controlled on low-dose amlodipine Blood  pressure well controlled on low-dose amlodipine. - Continue amlodipine 2.5 mg daily.  Obstructive sleep apnea, untreated Severe obstructive sleep apnea with CPAP intolerance. Discussed  Inspire device as alternative. Referral to ENT made by PMD. - Follow up with ENT group regarding Inspire device referral.  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 - Wear compression socks when possible. - Elevate legs at the end of the day.       Soyla Merck, MD, FACC

## 2024-09-30 ENCOUNTER — Emergency Department (HOSPITAL_BASED_OUTPATIENT_CLINIC_OR_DEPARTMENT_OTHER)

## 2024-09-30 ENCOUNTER — Encounter (HOSPITAL_BASED_OUTPATIENT_CLINIC_OR_DEPARTMENT_OTHER): Payer: Self-pay

## 2024-09-30 ENCOUNTER — Encounter: Payer: Self-pay | Admitting: Internal Medicine

## 2024-09-30 ENCOUNTER — Emergency Department (HOSPITAL_BASED_OUTPATIENT_CLINIC_OR_DEPARTMENT_OTHER)
Admission: EM | Admit: 2024-09-30 | Discharge: 2024-09-30 | Disposition: A | Discharge status: Home / Self Care | Attending: Emergency Medicine | Admitting: Emergency Medicine

## 2024-09-30 ENCOUNTER — Other Ambulatory Visit: Payer: Self-pay

## 2024-09-30 ENCOUNTER — Other Ambulatory Visit (HOSPITAL_BASED_OUTPATIENT_CLINIC_OR_DEPARTMENT_OTHER): Payer: Self-pay

## 2024-09-30 DIAGNOSIS — R059 Cough, unspecified: Secondary | ICD-10-CM | POA: Diagnosis not present

## 2024-09-30 DIAGNOSIS — D72829 Elevated white blood cell count, unspecified: Secondary | ICD-10-CM | POA: Diagnosis not present

## 2024-09-30 DIAGNOSIS — R519 Headache, unspecified: Secondary | ICD-10-CM | POA: Diagnosis not present

## 2024-09-30 DIAGNOSIS — Z7982 Long term (current) use of aspirin: Secondary | ICD-10-CM | POA: Diagnosis not present

## 2024-09-30 DIAGNOSIS — Z79899 Other long term (current) drug therapy: Secondary | ICD-10-CM | POA: Insufficient documentation

## 2024-09-30 DIAGNOSIS — R062 Wheezing: Secondary | ICD-10-CM | POA: Insufficient documentation

## 2024-09-30 DIAGNOSIS — N183 Chronic kidney disease, stage 3 unspecified: Secondary | ICD-10-CM | POA: Insufficient documentation

## 2024-09-30 DIAGNOSIS — I129 Hypertensive chronic kidney disease with stage 1 through stage 4 chronic kidney disease, or unspecified chronic kidney disease: Secondary | ICD-10-CM | POA: Diagnosis not present

## 2024-09-30 DIAGNOSIS — R079 Chest pain, unspecified: Secondary | ICD-10-CM | POA: Diagnosis present

## 2024-09-30 DIAGNOSIS — I4891 Unspecified atrial fibrillation: Secondary | ICD-10-CM | POA: Insufficient documentation

## 2024-09-30 DIAGNOSIS — J40 Bronchitis, not specified as acute or chronic: Secondary | ICD-10-CM

## 2024-09-30 LAB — CBC WITH DIFFERENTIAL/PLATELET
Abs Immature Granulocytes: 0.04 K/uL (ref 0.00–0.07)
Basophils Absolute: 0.1 K/uL (ref 0.0–0.1)
Basophils Relative: 1 %
Eosinophils Absolute: 0.5 K/uL (ref 0.0–0.5)
Eosinophils Relative: 5 %
HCT: 43.8 % (ref 39.0–52.0)
Hemoglobin: 15.1 g/dL (ref 13.0–17.0)
Immature Granulocytes: 0 %
Lymphocytes Relative: 24 %
Lymphs Abs: 2.6 K/uL (ref 0.7–4.0)
MCH: 30.1 pg (ref 26.0–34.0)
MCHC: 34.5 g/dL (ref 30.0–36.0)
MCV: 87.3 fL (ref 80.0–100.0)
Monocytes Absolute: 1.1 K/uL — ABNORMAL HIGH (ref 0.1–1.0)
Monocytes Relative: 10 %
Neutro Abs: 6.7 K/uL (ref 1.7–7.7)
Neutrophils Relative %: 60 %
Platelets: 205 K/uL (ref 150–400)
RBC: 5.02 MIL/uL (ref 4.22–5.81)
RDW: 14.1 % (ref 11.5–15.5)
WBC: 11.1 K/uL — ABNORMAL HIGH (ref 4.0–10.5)
nRBC: 0 % (ref 0.0–0.2)

## 2024-09-30 LAB — COMPREHENSIVE METABOLIC PANEL WITH GFR
ALT: 16 U/L (ref 0–44)
AST: 18 U/L (ref 15–41)
Albumin: 4.2 g/dL (ref 3.5–5.0)
Alkaline Phosphatase: 72 U/L (ref 38–126)
Anion gap: 11 (ref 5–15)
BUN: 18 mg/dL (ref 8–23)
CO2: 24 mmol/L (ref 22–32)
Calcium: 9.7 mg/dL (ref 8.9–10.3)
Chloride: 106 mmol/L (ref 98–111)
Creatinine, Ser: 1.37 mg/dL — ABNORMAL HIGH (ref 0.61–1.24)
GFR, Estimated: 53 mL/min — ABNORMAL LOW (ref >60)
Glucose, Bld: 112 mg/dL — ABNORMAL HIGH (ref 70–99)
Potassium: 4 mmol/L (ref 3.5–5.1)
Sodium: 140 mmol/L (ref 135–145)
Total Bilirubin: 0.5 mg/dL (ref 0.0–1.2)
Total Protein: 7.3 g/dL (ref 6.5–8.1)

## 2024-09-30 LAB — RESP PANEL BY RT-PCR (RSV, FLU A&B, COVID)  RVPGX2
Influenza A by PCR: NEGATIVE
Influenza B by PCR: NEGATIVE
Resp Syncytial Virus by PCR: NEGATIVE
SARS Coronavirus 2 by RT PCR: NEGATIVE

## 2024-09-30 LAB — TROPONIN T, HIGH SENSITIVITY
Troponin T High Sensitivity: 28 ng/L — ABNORMAL HIGH (ref 0–19)
Troponin T High Sensitivity: 28 ng/L — ABNORMAL HIGH (ref 0–19)

## 2024-09-30 MED ORDER — APIXABAN 5 MG PO TABS
5.0000 mg | ORAL_TABLET | Freq: Two times a day (BID) | ORAL | 0 refills | Status: DC
  Filled 2024-09-30: qty 60, 30d supply, fill #0

## 2024-09-30 MED ORDER — APIXABAN 5 MG PO TABS: 5.0000 mg | ORAL_TABLET | Freq: Two times a day (BID) | ORAL | 0 refills | Status: DC

## 2024-09-30 MED ORDER — PREDNISONE 10 MG PO TABS
40.0000 mg | ORAL_TABLET | Freq: Every day | ORAL | 0 refills | Status: AC
  Filled 2024-09-30: qty 16, 4d supply, fill #0

## 2024-09-30 MED ORDER — IOHEXOL 350 MG/ML SOLN
100.0000 mL | Freq: Once | INTRAVENOUS | Status: AC | PRN
  Administered 2024-09-30: 100 mL via INTRAVENOUS

## 2024-09-30 MED ORDER — PREDNISONE 50 MG PO TABS
60.0000 mg | ORAL_TABLET | Freq: Once | ORAL | Status: AC
  Administered 2024-09-30: 60 mg via ORAL
  Filled 2024-09-30: qty 1

## 2024-09-30 MED ORDER — DOXYCYCLINE HYCLATE 100 MG PO CAPS
100.0000 mg | ORAL_CAPSULE | Freq: Two times a day (BID) | ORAL | 0 refills | Status: AC
  Filled 2024-09-30: qty 14, 7d supply, fill #0

## 2024-09-30 NOTE — ED Triage Notes (Signed)
 Pt states that he started having some sinus issues last week. Was seen by the PCP on Friday. States that they gave him a Zpack and has 1 pill left. States that he is not having any better feeling and now it is all in the chest. Chest pain at times when coughing and taking deep breaths.

## 2024-09-30 NOTE — ED Notes (Signed)
 ED Provider at bedside.

## 2024-09-30 NOTE — ED Provider Notes (Addendum)
 [place] [facility] Provider Note   CSN: [id number] Arrival date & time: 09/30/24  1143     Patient presents with: Chest Pain   Brandon Burns is a 78 y.o. male.   HPI      78 year old male with history of CKD, hypertension, hyperlipidemia, aortic atherosclerosis who presents with concern for cough and chest pain from coughing.    Cough for 1.5 weeks Went to walk in clinic last Friday Received antibiotics for 5 days  Not feeling better Headache, hurts to cough because coughing so much , no recent head trauma. Had cath 10/2023, feels this pain is because of coughing so much  Greenish looking sputum , brown tinge at times Congestion, nasal congestion, allergy pill helped some but then when it wears off still running No leg pain or swelling No known fever No nausea, vomitign or diarrhea No numbness/weakness/chage in vision Some wheezing No hx of smoking, no asthma hx  Usually gets abx, steroids and feels better No recent surgeries, no long trips recently     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   Lumbar degenerative disc disease 04/18/2017   Mixed hyperlipidemia 09/25/2019   Stage 3 chronic kidney disease (HCC) 01/06/2018   Vitamin D deficiency 12/05/2020    Past Surgical History:  Procedure Laterality Date   BACK SURGERY     CHOLECYSTECTOMY     HEMORRHOID SURGERY     LEFT HEART CATH AND CORONARY ANGIOGRAPHY N/A 10/19/2023   Procedure: LEFT HEART CATH AND CORONARY ANGIOGRAPHY;  Surgeon: Verlin Lonni BIRCH, MD;  Location: [facility] INVASIVE CV LAB;  Service: Cardiovascular;  Laterality: N/A;   REPLACEMENT  TOTAL KNEE Left 12/10/2021    Prior to Admission medications   Medication Sig Start Date End Date Taking? Authorizing Provider  azithromycin (ZITHROMAX) 250 MG tablet See admin instructions. TAKE 2 TABLETS (500 MG) BY ORAL ROUTE ONCE DAILY FOR 1 DAY THEN 1 TABLET (250 MG) BY ORAL ROUTE ONCE DAILY FOR 4 DAYS 09/27/24  Yes [provider]  CLARITIN 10 MG tablet Take 10 mg by mouth daily. 09/27/24  Yes [provider]  omeprazole (PRILOSEC) 40 MG capsule Take 40 mg by mouth every morning. 08/08/24  Yes [provider]  amLODipine (NORVASC) 2.5 MG tablet Take 2.5 mg by mouth daily.    [provider]  aspirin  EC 81 MG tablet Take 81 mg by mouth daily. Swallow whole.    [provider]  Brimonidine Tartrate (LUMIFY) 0.025 % SOLN Place 1 drop into both eyes daily.    [provider]  Cholecalciferol (VITAMIN D3) 50 MCG (2000 UT) TABS Take 2,000 Units by mouth daily.    [provider]  ezetimibe  (ZETIA ) 10 MG tablet Take 1 tablet (10 mg total) by mouth daily. 07/02/24 09/30/24  Acharya, Gayatri A, MD  omeprazole (PRILOSEC OTC) 20 MG tablet Take 20 mg by mouth daily before breakfast.    [provider]  omeprazole (PRILOSEC) 20 MG capsule Take 20 mg by mouth daily.    [provider]  rosuvastatin  (CRESTOR ) 20 MG tablet Take 1 tablet (20 mg total) by mouth daily. Patient not taking:  Reported on 07/02/2024 07/05/23 10/03/23  Acharya, Gayatri A, MD  tiZANidine  (ZANAFLEX ) 4 MG tablet Take 4 mg by mouth at bedtime.    [provider]  vitamin B-12 (CYANOCOBALAMIN) 500 MCG tablet Take 500 mcg by mouth daily.    [provider]  zolpidem  (AMBIEN ) 10 MG tablet Take 10 mg by mouth at bedtime.    [provider]    Allergies: Atorvastatin, Pravastatin, and Rosuvastatin     Review of Systems  Updated Vital Signs BP (!) 153/79 (BP Location: Right Arm)   Pulse 62   Temp 97.8 F (36.6 C)   Resp (!) 24    Ht 6' 1 (1.854 m)   Wt 103.4 kg   SpO2 100%   BMI 30.08 kg/m   Physical Exam Vitals and nursing note reviewed.  Constitutional:      General: He is not in acute distress.    Appearance: He is well-developed. He is not diaphoretic.  HENT:     Head: Normocephalic and atraumatic.  Eyes:     Conjunctiva/sclera: Conjunctivae normal.  Cardiovascular:     Rate and Rhythm: Normal rate and regular rhythm.     Heart sounds: Normal heart sounds. No murmur heard.    No friction rub. No gallop.  Pulmonary:     Effort: Pulmonary effort is normal. No respiratory distress.     Breath sounds: Normal breath sounds. No wheezing or rales.  Abdominal:     General: There is no distension.     Palpations: Abdomen is soft.     Tenderness: There is no abdominal tenderness. There is no guarding.  Musculoskeletal:     Cervical back: Normal range of motion.  Skin:    General: Skin is warm and dry.  Neurological:     Mental Status: He is alert and oriented to person, place, and time.     (all labs ordered are listed, but only abnormal results are displayed) Labs Reviewed  CBC WITH DIFFERENTIAL/PLATELET - Abnormal; Notable for the following components:      Result Value   WBC 11.1 (*)    Monocytes Absolute 1.1 (*)    All other components within normal limits  COMPREHENSIVE METABOLIC PANEL WITH GFR - Abnormal; Notable for the following components:   Glucose, Bld 112 (*)    Creatinine, Ser 1.37 (*)    GFR, Estimated 53 (*)    All other components within normal limits  TROPONIN T, HIGH SENSITIVITY - Abnormal; Notable for the following components:   Troponin T High Sensitivity 28 (*)    All other components within normal limits  RESP PANEL BY RT-PCR (RSV, FLU A&B, COVID)  [id number]  TROPONIN T, HIGH SENSITIVITY    EKG: EKG Interpretation Date/Time:  Monday September 30 2024 14:18:13 EDT Ventricular Rate:  75 PR Interval:    QRS Duration:  105 QT Interval:  402 QTC Calculation: 449 R  Axis:   64  Text Interpretation: Atrial fibrillation Abnormal R-wave progression, early transition Since prior ECG, atrial fibrillation is new Confirmed by Dreama Longs ([id number]) on 09/30/2024 2:36:25 PM  Radiology: DG Chest 2 View Result Date: 09/30/2024 EXAM: 2 VIEW(S) XRAY OF THE CHEST 09/30/2024 12:28:29 PM COMPARISON: 04/17/2024 CLINICAL HISTORY: chest pain. Pt states that he started having some sinus issues last week. Was seen by the PCP on Friday. States that they gave him a Zpack and has 1 pill left. States that he is not having any better feeling and now it is all in the chest. Chest  pain at times when ; coughing and taking deep breaths. FINDINGS: LUNGS AND PLEURA: No focal pulmonary opacity. No pulmonary edema. No pleural effusion. No pneumothorax. HEART AND MEDIASTINUM: No acute abnormality of the cardiac and mediastinal silhouettes. BONES AND SOFT TISSUES: Degenerative changes of thoracic spine. No acute osseous abnormality. IMPRESSION: 1. No acute cardiopulmonary process. Electronically signed by: Waddell Calk MD 09/30/2024 01:59 PM EDT RP Workstation: [id number]     Procedures   Medications Ordered in the ED  predniSONE (DELTASONE) tablet 60 mg (60 mg Oral Given 09/30/24 1246)                                      78 year old male with history of CKD, hypertension, hyperlipidemia, aortic atherosclerosis who presents with concern for cough and chest pain from coughing.   DDx includes pneumonia, PE, ACS, viral syndrome, pneumothorax, other.  Labs completed personally about interpreted by me showing negative COVID, flu and RSV test.  Labs are completed and personally about interpreted by me show no anemia, mild leukocytosis.  CMP shows stable creatinine, no clinically significant electrolyte abnormalities.  Initial troponin is slightly positive at 28, suspect in setting of CKD.  Repeat troponin pending.  Chest x-ray completed and evaluated by me and raddiology shows no  acute cardiopulmonary process.  Initial ECG with concern for ?wenkebach but unclear rhythm, discussed with Cardiology Dr. Burton.  Second ECG looks consistent with atrial fibrillation.   Given new atrial fibrillation, pleuritic pain, cough with clear XR will obtain CT PE study to evaluate for PE and/or occult pneumonia.   CT PE study pending at time of transfer of care.  If no PE would consider outpatient follow up with afib clinic, discussed risks of eliquis.  CT head ordered given presence of headache, plan to start anticoagulation to ensure no signs of ICH prior to initiation.   Second troponin stable, do not suspect ACS, mild elevation in setting of CKD.  CT PE study shows no PE, does show findings consistent with bronchitis.  CT head without acute abnormalities.   Reports improvement in past with steroids with bronchitis--will treat for possible bacterial bronchitis with 1.5 weeks of symptoms with doxycycline, and give prednisone.    Given rx for eliquis with discussion of risks and ambulatory follow up with afib clinic. Patient discharged in stable condition with understanding of reasons to return.   Final diagnoses:  Cough, unspecified type  Atrial fibrillation, unspecified type [facility])    ED Discharge Orders     None        Dreama Longs, MD 09/30/24 2159

## 2024-10-04 ENCOUNTER — Ambulatory Visit (HOSPITAL_COMMUNITY)
Admission: RE | Admit: 2024-10-04 | Discharge: 2024-10-04 | Disposition: A | Discharge status: Home / Self Care | Source: Ambulatory Visit | Attending: Physician Assistant | Admitting: Physician Assistant

## 2024-10-04 VITALS — BP 126/76 | HR 53 | Ht 73.0 in | Wt 228.4 lb

## 2024-10-04 DIAGNOSIS — I4891 Unspecified atrial fibrillation: Secondary | ICD-10-CM | POA: Diagnosis not present

## 2024-10-04 DIAGNOSIS — I4819 Other persistent atrial fibrillation: Secondary | ICD-10-CM | POA: Diagnosis not present

## 2024-10-04 DIAGNOSIS — D6869 Other thrombophilia: Secondary | ICD-10-CM | POA: Diagnosis not present

## 2024-10-04 MED ORDER — APIXABAN 5 MG PO TABS: 5.0000 mg | ORAL_TABLET | Freq: Two times a day (BID) | ORAL | 3 refills | Status: AC

## 2024-10-04 NOTE — Progress Notes (Signed)
 Primary Care Physician: Millicent Sharper, MD Primary Cardiologist: Soyla DELENA Merck, MD Electrophysiologist: None  Referring Physician: ED   Brandon Burns is a 78 y.o. male with a history of CAD, HLD, HTN, CKD, OSA, chronic lower extremity edema, atrial fibrillation who presents for follow up in the [facility].  The patient was initially diagnosed with atrial fibrillation 09/30/24 after presenting to the ED with symptoms of cough and chest pain. ECG showed rate controlled afib. Symptoms felt to be related to bronchitis. CTA showed no PE. He was started on doxycycline and prednisone. Patient was started on Eliquis for stroke prevention.    Patient presents today for follow up for atrial fibrillation. He is in afib today. He reports at the ED his rhythm would flip back and forth between SR and afib. He is unaware of his arrhythmia. He has not yet started Eliquis. He does have OSA and has been referred for [facility].   Today, he denies symptoms of palpitations, chest pain, shortness of breath, orthopnea, PND, lower extremity edema, dizziness, presyncope, syncope, snoring, daytime somnolence, bleeding, or neurologic sequela. The patient is tolerating medications without difficulties and is otherwise without complaint today.    Atrial Fibrillation Risk Factors:  he does have symptoms or diagnosis of sleep apnea. he does not have a history of rheumatic fever. he does have a history of alcohol use. The patient does not have a history of early familial atrial fibrillation or other arrhythmias.  Atrial Fibrillation Management history:  Previous antiarrhythmic drugs: none Previous cardioversions: none Previous ablations: none Anticoagulation history: Eliquis  ROS- All systems are reviewed and negative except as per the HPI above.  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     amLODipine (NORVASC) 2.5 MG tablet Take 2.5 mg by mouth daily.     Brimonidine Tartrate (LUMIFY) 0.025 % SOLN Place 1 drop into both eyes daily.     Cholecalciferol (VITAMIN D3) 50 MCG (2000 UT) TABS Take 2,000 Units by mouth daily.     doxycycline (VIBRAMYCIN) 100 MG capsule Take 1 capsule (100 mg total) by mouth 2 (two) times daily for 7 days. 14 capsule 0   ezetimibe  (ZETIA ) 10 MG tablet Take 1 tablet (10 mg total) by mouth daily. 90 tablet 3   omeprazole (PRILOSEC OTC) 20 MG tablet Take 20 mg by mouth daily before breakfast.     omeprazole (PRILOSEC) 20 MG capsule Take 20 mg by mouth daily.     omeprazole (PRILOSEC) 40 MG capsule Take 40 mg by mouth every morning.     predniSONE (DELTASONE) 10 MG tablet Take 4 tablets (40 mg total) by mouth daily for 4 days. 16 tablet 0   tiZANidine  (ZANAFLEX ) 4 MG tablet Take 4 mg by mouth at bedtime.     vitamin B-12 (CYANOCOBALAMIN) 500 MCG tablet Take 500 mcg by mouth  daily.     zolpidem  (AMBIEN ) 10 MG tablet Take 10 mg by mouth at bedtime.     apixaban (ELIQUIS) 5 MG TABS tablet Take 1 tablet (5 mg total) by mouth 2 (two) times daily. 60 tablet 0   rosuvastatin  (CRESTOR ) 20 MG tablet Take 1 tablet (20 mg total) by mouth daily. (Patient not taking: Reported on 10/04/2024) 90 tablet 3   No current facility-administered medications for this encounter.    Physical Exam: BP 126/76   Pulse (!) 53   Ht 6' 1 (1.854 m)    Wt 103.6 kg   BMI 30.13 kg/m   GEN: Well nourished, well developed in no acute distress CARDIAC: Irregularly irregular rate and rhythm, no murmurs, rubs, gallops RESPIRATORY:  Clear to auscultation without rales, wheezing or rhonchi  ABDOMEN: Soft, non-tender, non-distended EXTREMITIES:  No edema; No deformity   Wt Readings from Last 3 Encounters:  10/04/24 103.6 kg  09/30/24 103.4 kg  07/02/24 101.6 kg     EKG today demonstrates  Afib Vent. rate 53 BPM PR interval * ms QRS duration 88 ms QT/QTcB 450/422 ms  Echo 06/19/23 demonstrated   1. Left ventricular ejection fraction, by estimation, is 55 to 60%. The  left ventricle has normal function. The left ventricle has no regional  wall motion abnormalities. There is mild concentric left ventricular  hypertrophy. Left ventricular diastolic parameters were normal.   2. Right ventricular systolic function is normal. The right ventricular  size is normal.   3. The mitral valve is normal in structure. No evidence of mitral valve  regurgitation. No evidence of mitral stenosis.   4. The aortic valve is normal in structure. Aortic valve regurgitation is  trivial. No aortic stenosis is present.   5. The inferior vena cava is normal in size with greater than 50%  respiratory variability, suggesting right atrial pressure of 3 mmHg.    CHA2DS2-VASc Score = 4  The patient's score is based upon: CHF History: 0 HTN History: 1 Diabetes History: 0 Stroke History: 0 Vascular Disease History: 1 Age Score: 2 Gender Score: 0       ASSESSMENT AND PLAN: Persistent Atrial Fibrillation (ICD10:  I48.19) The patient's CHA2DS2-VASc score is 4, indicating a 4.8% annual risk of stroke.   Patient in rate controlled afib today, asymptomatic.  Suspect he is persistent but was paroxysmal at the ED per patient report. Will have him wear a 2 week Zio monitor to better assess arrhythmia burden.  Start Eliquis 5 mg BID  Secondary Hypercoagulable  State (ICD10:  D68.69) The patient is at significant risk for stroke/thromboembolism based upon his CHA2DS2-VASc Score of 4.  Start Apixaban (Eliquis). Patient has a very active lifestyle (hunting, 4 wheeling, etc.) and he is concerned about his bleeding risk on anticoagulation. Watchman brochure provided. Will consider in the future.   CAD/aortic atherosclerosis No anginal symptoms Followed by Dr Loni  HTN Stable on current regimen  OSA  The importance of adequate treatment of sleep apnea was discussed today in order to improve our ability to maintain sinus rhythm long term. Patient has been previously referred for [facility] evaluation.    Follow up in the AF clinic in one month.     [facility] [address] [place], [place] [address] [phone]

## 2024-10-13 ENCOUNTER — Emergency Department (HOSPITAL_BASED_OUTPATIENT_CLINIC_OR_DEPARTMENT_OTHER)

## 2024-10-13 ENCOUNTER — Encounter (HOSPITAL_BASED_OUTPATIENT_CLINIC_OR_DEPARTMENT_OTHER): Payer: Self-pay

## 2024-10-13 ENCOUNTER — Inpatient Hospital Stay (HOSPITAL_BASED_OUTPATIENT_CLINIC_OR_DEPARTMENT_OTHER)
Admission: EM | Admit: 2024-10-13 | Discharge: 2024-10-16 | DRG: 308 | Disposition: A | Discharge status: Home / Self Care | Attending: Hospitalist | Admitting: Hospitalist

## 2024-10-13 DIAGNOSIS — Z96652 Presence of left artificial knee joint: Secondary | ICD-10-CM | POA: Diagnosis present

## 2024-10-13 DIAGNOSIS — G4700 Insomnia, unspecified: Secondary | ICD-10-CM | POA: Diagnosis present

## 2024-10-13 DIAGNOSIS — H9190 Unspecified hearing loss, unspecified ear: Secondary | ICD-10-CM | POA: Diagnosis present

## 2024-10-13 DIAGNOSIS — I340 Nonrheumatic mitral (valve) insufficiency: Secondary | ICD-10-CM | POA: Diagnosis not present

## 2024-10-13 DIAGNOSIS — Z7901 Long term (current) use of anticoagulants: Secondary | ICD-10-CM | POA: Diagnosis not present

## 2024-10-13 DIAGNOSIS — R001 Bradycardia, unspecified: Secondary | ICD-10-CM | POA: Diagnosis not present

## 2024-10-13 DIAGNOSIS — G4733 Obstructive sleep apnea (adult) (pediatric): Secondary | ICD-10-CM | POA: Diagnosis present

## 2024-10-13 DIAGNOSIS — I5021 Acute systolic (congestive) heart failure: Principal | ICD-10-CM

## 2024-10-13 DIAGNOSIS — I10 Essential (primary) hypertension: Secondary | ICD-10-CM | POA: Diagnosis not present

## 2024-10-13 DIAGNOSIS — R0609 Other forms of dyspnea: Secondary | ICD-10-CM | POA: Diagnosis not present

## 2024-10-13 DIAGNOSIS — I4819 Other persistent atrial fibrillation: Principal | ICD-10-CM | POA: Diagnosis present

## 2024-10-13 DIAGNOSIS — I5031 Acute diastolic (congestive) heart failure: Secondary | ICD-10-CM | POA: Diagnosis present

## 2024-10-13 DIAGNOSIS — I2510 Atherosclerotic heart disease of native coronary artery without angina pectoris: Secondary | ICD-10-CM | POA: Diagnosis present

## 2024-10-13 DIAGNOSIS — I509 Heart failure, unspecified: Secondary | ICD-10-CM | POA: Insufficient documentation

## 2024-10-13 DIAGNOSIS — I4891 Unspecified atrial fibrillation: Secondary | ICD-10-CM | POA: Diagnosis present

## 2024-10-13 DIAGNOSIS — N1831 Chronic kidney disease, stage 3a: Secondary | ICD-10-CM | POA: Diagnosis present

## 2024-10-13 DIAGNOSIS — I130 Hypertensive heart and chronic kidney disease with heart failure and stage 1 through stage 4 chronic kidney disease, or unspecified chronic kidney disease: Secondary | ICD-10-CM | POA: Diagnosis present

## 2024-10-13 DIAGNOSIS — D6869 Other thrombophilia: Secondary | ICD-10-CM | POA: Diagnosis present

## 2024-10-13 DIAGNOSIS — E538 Deficiency of other specified B group vitamins: Secondary | ICD-10-CM | POA: Diagnosis present

## 2024-10-13 DIAGNOSIS — K219 Gastro-esophageal reflux disease without esophagitis: Secondary | ICD-10-CM | POA: Diagnosis present

## 2024-10-13 DIAGNOSIS — Z79899 Other long term (current) drug therapy: Secondary | ICD-10-CM | POA: Diagnosis not present

## 2024-10-13 DIAGNOSIS — Z888 Allergy status to other drugs, medicaments and biological substances status: Secondary | ICD-10-CM

## 2024-10-13 DIAGNOSIS — I441 Atrioventricular block, second degree: Secondary | ICD-10-CM | POA: Diagnosis not present

## 2024-10-13 DIAGNOSIS — E782 Mixed hyperlipidemia: Secondary | ICD-10-CM | POA: Diagnosis present

## 2024-10-13 DIAGNOSIS — I700 Atherosclerosis of aorta: Secondary | ICD-10-CM | POA: Diagnosis present

## 2024-10-13 DIAGNOSIS — I129 Hypertensive chronic kidney disease with stage 1 through stage 4 chronic kidney disease, or unspecified chronic kidney disease: Secondary | ICD-10-CM | POA: Diagnosis not present

## 2024-10-13 LAB — BASIC METABOLIC PANEL WITH GFR
Anion gap: 12 (ref 5–15)
BUN: 18 mg/dL (ref 8–23)
CO2: 23 mmol/L (ref 22–32)
Calcium: 9.1 mg/dL (ref 8.9–10.3)
Chloride: 104 mmol/L (ref 98–111)
Creatinine, Ser: 1.37 mg/dL — ABNORMAL HIGH (ref 0.61–1.24)
GFR, Estimated: 53 mL/min — ABNORMAL LOW (ref >60)
Glucose, Bld: 120 mg/dL — ABNORMAL HIGH (ref 70–99)
Potassium: 4 mmol/L (ref 3.5–5.1)
Sodium: 139 mmol/L (ref 135–145)

## 2024-10-13 LAB — URINALYSIS, ROUTINE W REFLEX MICROSCOPIC
Bilirubin Urine: NEGATIVE
Glucose, UA: NEGATIVE mg/dL
Ketones, ur: NEGATIVE mg/dL
Leukocytes,Ua: NEGATIVE
Nitrite: NEGATIVE
Protein, ur: NEGATIVE mg/dL
Specific Gravity, Urine: 1.02 (ref 1.005–1.030)
pH: 5.5 (ref 5.0–8.0)

## 2024-10-13 LAB — CBC
HCT: 38.6 % — ABNORMAL LOW (ref 39.0–52.0)
Hemoglobin: 13.2 g/dL (ref 13.0–17.0)
MCH: 30 pg (ref 26.0–34.0)
MCHC: 34.2 g/dL (ref 30.0–36.0)
MCV: 87.7 fL (ref 80.0–100.0)
Platelets: 186 K/uL (ref 150–400)
RBC: 4.4 MIL/uL (ref 4.22–5.81)
RDW: 14.2 % (ref 11.5–15.5)
WBC: 11.8 K/uL — ABNORMAL HIGH (ref 4.0–10.5)
nRBC: 0 % (ref 0.0–0.2)

## 2024-10-13 LAB — TROPONIN T, HIGH SENSITIVITY: Troponin T High Sensitivity: 47 ng/L — ABNORMAL HIGH (ref 0–19)

## 2024-10-13 LAB — LACTIC ACID, PLASMA
Lactic Acid, Venous: 1.7 mmol/L (ref 0.5–1.9)
Lactic Acid, Venous: 1.7 mmol/L (ref 0.5–1.9)

## 2024-10-13 LAB — URINALYSIS, MICROSCOPIC (REFLEX)
Squamous Epithelial / HPF: NONE SEEN /HPF (ref 0–5)
WBC, UA: NONE SEEN WBC/hpf (ref 0–5)

## 2024-10-13 LAB — HEPATIC FUNCTION PANEL
ALT: 37 U/L (ref 0–44)
AST: 25 U/L (ref 15–41)
Albumin: 4 g/dL (ref 3.5–5.0)
Alkaline Phosphatase: 72 U/L (ref 38–126)
Bilirubin, Direct: 0.3 mg/dL — ABNORMAL HIGH (ref 0.0–0.2)
Indirect Bilirubin: 0.4 mg/dL (ref 0.3–0.9)
Total Bilirubin: 0.6 mg/dL (ref 0.0–1.2)
Total Protein: 6.4 g/dL — ABNORMAL LOW (ref 6.5–8.1)

## 2024-10-13 LAB — PRO BRAIN NATRIURETIC PEPTIDE: Pro Brain Natriuretic Peptide: 3179 pg/mL — ABNORMAL HIGH (ref <300.0)

## 2024-10-13 LAB — LIPASE, BLOOD: Lipase: 19 U/L (ref 11–51)

## 2024-10-13 MED ORDER — ZOLPIDEM TARTRATE 5 MG PO TABS
5.0000 mg | ORAL_TABLET | Freq: Every day | ORAL | Status: DC
  Administered 2024-10-13 – 2024-10-15 (×3): 5 mg via ORAL
  Filled 2024-10-13 (×3): qty 1

## 2024-10-13 MED ORDER — APIXABAN 5 MG PO TABS
5.0000 mg | ORAL_TABLET | Freq: Two times a day (BID) | ORAL | Status: DC
  Administered 2024-10-13 – 2024-10-16 (×6): 5 mg via ORAL
  Filled 2024-10-13 (×6): qty 1

## 2024-10-13 MED ORDER — ACETAMINOPHEN 650 MG RE SUPP: 650.0000 mg | Freq: Four times a day (QID) | RECTAL | Status: DC | PRN

## 2024-10-13 MED ORDER — ONDANSETRON HCL 4 MG/2ML IJ SOLN
4.0000 mg | Freq: Four times a day (QID) | INTRAMUSCULAR | Status: DC | PRN
Start: 2024-10-13 — End: 2024-10-16

## 2024-10-13 MED ORDER — ACETAMINOPHEN 325 MG PO TABS
650.0000 mg | ORAL_TABLET | Freq: Four times a day (QID) | ORAL | Status: DC | PRN
  Administered 2024-10-15: 650 mg via ORAL
  Filled 2024-10-13: qty 2

## 2024-10-13 MED ORDER — FUROSEMIDE 10 MG/ML IJ SOLN
20.0000 mg | Freq: Once | INTRAMUSCULAR | Status: AC
  Administered 2024-10-13: 20 mg via INTRAVENOUS
  Filled 2024-10-13: qty 2

## 2024-10-13 MED ORDER — PANTOPRAZOLE SODIUM 40 MG PO TBEC
40.0000 mg | DELAYED_RELEASE_TABLET | Freq: Every day | ORAL | Status: DC
  Administered 2024-10-14 – 2024-10-16 (×3): 40 mg via ORAL
  Filled 2024-10-13 (×3): qty 1

## 2024-10-13 MED ORDER — TIZANIDINE HCL 4 MG PO TABS
4.0000 mg | ORAL_TABLET | Freq: Every day | ORAL | Status: DC
  Administered 2024-10-13 – 2024-10-15 (×3): 4 mg via ORAL
  Filled 2024-10-13 (×3): qty 1

## 2024-10-13 MED ORDER — SENNOSIDES-DOCUSATE SODIUM 8.6-50 MG PO TABS: 1.0000 | ORAL_TABLET | Freq: Every evening | ORAL | Status: DC | PRN

## 2024-10-13 MED ORDER — SODIUM CHLORIDE 0.9% FLUSH
3.0000 mL | Freq: Two times a day (BID) | INTRAVENOUS | Status: DC
  Administered 2024-10-13 – 2024-10-16 (×5): 3 mL via INTRAVENOUS

## 2024-10-13 MED ORDER — ONDANSETRON HCL 4 MG PO TABS: 4.0000 mg | ORAL_TABLET | Freq: Four times a day (QID) | ORAL | Status: DC | PRN

## 2024-10-13 MED ORDER — AMLODIPINE BESYLATE 2.5 MG PO TABS
2.5000 mg | ORAL_TABLET | Freq: Every day | ORAL | Status: DC
  Administered 2024-10-14 – 2024-10-16 (×3): 2.5 mg via ORAL
  Filled 2024-10-13 (×3): qty 1

## 2024-10-13 NOTE — ED Notes (Signed)
 ED TO INPATIENT HANDOFF REPORT  ED Nurse Name and Phone #: Sharlet Lemmings RN [phone]  S Name/Age/Gender Brandon Burns 78 y.o. male Room/Bed: MH10/MH10  Code Status   Code Status: Prior  Home/SNF/Other Home Patient oriented to: self, place, time, and situation Is this baseline? Yes   Triage Complete: Triage complete  Chief Complaint Acute CHF (congestive heart failure) (HCC) [I50.9]  Triage Note States was found to be in A.fib on 10/27, started on eliquis. Has been short of breath since. Today has been increasingly short of breath on exertion. Hypertensive at home. Denies chest pain.   Allergies Allergies  Allergen Reactions   Atorvastatin Other (See Comments)   Pravastatin Other (See Comments)   Rosuvastatin  Other (See Comments)    Level of Care/Admitting Diagnosis ED Disposition     ED Disposition  Admit   Condition  --   Comment  Hospital Area: [facility] [[id number]]  Level of Care: Telemetry [5]  May admit patient to [facility] or [facility] if equivalent level of care is available:: No  Interfacility transfer: Yes  Diagnosis: Acute CHF (congestive heart failure) [facility]) [[id number]]  Admitting Physician: LAWENCE MADISON LABOR [[id number]]  Attending Physician: LAWENCE MADISON LABOR [[id number]]  Certification:: I certify this patient will need inpatient services for at least 2 midnights  Expected Medical Readiness: 10/15/2024          B Medical/Surgery History 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   Lumbar degenerative disc disease 04/18/2017   Mixed hyperlipidemia 09/25/2019   Stage 3 chronic kidney disease (HCC) 01/06/2018    Vitamin D deficiency 12/05/2020   Past Surgical History:  Procedure Laterality Date   BACK SURGERY     CHOLECYSTECTOMY     HEMORRHOID SURGERY     LEFT HEART CATH AND CORONARY ANGIOGRAPHY N/A 10/19/2023   Procedure: LEFT HEART CATH AND CORONARY ANGIOGRAPHY;  Surgeon: Verlin Lonni BIRCH, MD;  Location: [facility] INVASIVE CV LAB;  Service: Cardiovascular;  Laterality: N/A;   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     A IV Location/Drains/Wounds Patient Lines/Drains/Airways Status     Active Line/Drains/Airways     Name Placement date Placement time Site Days   Peripheral IV 10/13/24 20 G Posterior;Proximal;Right Forearm 10/13/24  1825  Forearm  less than 1            Intake/Output Last 24 hours No intake or output data in the 24 hours ending 10/13/24 2002  Labs/Imaging Results for orders placed or performed during the hospital encounter of 10/13/24 (from the past 48 hours)  Basic metabolic panel     Status: Abnormal   Collection Time: 10/13/24  6:11 PM  Result Value Ref Range   Sodium 139 135 - 145 mmol/L   Potassium 4.0 3.5 - 5.1 mmol/L   Chloride 104 98 - 111 mmol/L   CO2 23 22 - 32 mmol/L   Glucose, Bld 120 (H) 70 - 99 mg/dL    Comment: Glucose reference range applies only to samples taken after fasting for at least 8 hours.   BUN 18 8 - 23 mg/dL   Creatinine, Ser 8.62 (H) 0.61 - 1.24 mg/dL   Calcium  9.1  8.9 - 10.3 mg/dL   GFR, Estimated 53 (L) >60 mL/min    Comment: (NOTE) Calculated using the CKD-EPI Creatinine Equation (2021)    Anion gap 12 5 - 15    Comment: Performed at [facility], [address] [place], [place] [address]  CBC     Status: Abnormal   Collection Time: 10/13/24  6:11 PM  Result Value Ref Range   WBC 11.8 (H) 4.0 - 10.5 K/uL   RBC 4.40 4.22 - 5.81 MIL/uL   Hemoglobin 13.2 13.0 - 17.0 g/dL   HCT 61.3 (L) 60.9 - 47.9 %   MCV 87.7 80.0 - 100.0 fL   MCH 30.0 26.0 - 34.0 pg   MCHC 34.2 30.0 - 36.0 g/dL   RDW 85.7 88.4 - 84.4 %    Platelets 186 150 - 400 K/uL   nRBC 0.0 0.0 - 0.2 %    Comment: Performed at [facility], [address] [place], [place] [address]  Pro Brain natriuretic peptide     Status: Abnormal   Collection Time: 10/13/24  6:11 PM  Result Value Ref Range   Pro Brain Natriuretic Peptide 3,179.0 (H) <300.0 pg/mL    Comment: (NOTE) Age Group        Cut-Points    Interpretation  < 50 years     450 pg/mL       NT-proBNP > 450 pg/mL indicates                                ADHF is likely              50 to 75 years  900 pg/mL      NT-proBNP > 900 pg/mL indicates          ADHF is likely  > 75 years      1800 pg/mL     NT-proBNP > 1800 pg/mL indicates          ADHF is likely                           All ages    Results between       Indeterminate. Further clinical             300 and the cut-   information is needed to determine            point for age group   if ADHF is present.                                                             Elecsys proBNP II/ Elecsys proBNP II STAT           Cut-Point                       Interpretation  300 pg/mL                    NT-proBNP <300pg/mL indicates                             ADHF is not likely  Performed at [facility], [address] [place], [place] [address]   Troponin T, High Sensitivity     Status: Abnormal   Collection Time: 10/13/24  6:11 PM  Result Value Ref Range   Troponin T High Sensitivity 47 (H) 0 - 19 ng/L    Comment: (NOTE) Biotin concentrations > 1000 ng/mL falsely decrease TnT results.  Serial cardiac troponin measurements are suggested.  Refer to the Links section for chest pain algorithms and additional  guidance. Performed at [facility], [address] [place], [place] [address]   Hepatic function panel     Status: Abnormal   Collection Time: 10/13/24  6:11 PM  Result Value Ref Range   Total Protein 6.4 (L) 6.5 - 8.1 g/dL   Albumin 4.0 3.5 - 5.0 g/dL   AST 25 15 - 41 U/L    ALT 37 0 - 44 U/L   Alkaline Phosphatase 72 38 - 126 U/L   Total Bilirubin 0.6 0.0 - 1.2 mg/dL   Bilirubin, Direct 0.3 (H) 0.0 - 0.2 mg/dL   Indirect Bilirubin 0.4 0.3 - 0.9 mg/dL    Comment: Performed at [facility], [address] [place], [place] [address]  Lipase, blood     Status: None   Collection Time: 10/13/24  6:11 PM  Result Value Ref Range   Lipase 19 11 - 51 U/L    Comment: Performed at [facility], [address] [place], [place] [address]  Lactic acid, plasma     Status: None   Collection Time: 10/13/24  6:11 PM  Result Value Ref Range   Lactic Acid, Venous 1.7 0.5 - 1.9 mmol/L    Comment: Performed at [facility], [address] [place], [place] [address]  Urinalysis, Routine w reflex microscopic -Urine, Clean Catch     Status: Abnormal   Collection Time: 10/13/24  6:11 PM  Result Value Ref Range   Color, Urine YELLOW YELLOW   APPearance CLEAR CLEAR   Specific Gravity, Urine 1.020 1.005 - 1.030   pH 5.5 5.0 - 8.0   Glucose, UA NEGATIVE NEGATIVE mg/dL   Hgb urine dipstick TRACE (A) NEGATIVE   Bilirubin Urine NEGATIVE NEGATIVE   Ketones, ur NEGATIVE NEGATIVE mg/dL   Protein, ur NEGATIVE NEGATIVE mg/dL   Nitrite NEGATIVE NEGATIVE   Leukocytes,Ua NEGATIVE NEGATIVE    Comment: Performed at [facility], [address] [place], [place] [address]  Urinalysis, Microscopic (reflex)     Status: Abnormal   Collection Time: 10/13/24  6:11 PM  Result Value Ref Range   RBC / HPF 0-5 0 - 5 RBC/hpf   WBC, UA NONE SEEN 0 - 5 WBC/hpf   Bacteria, UA RARE (A) NONE SEEN   Squamous Epithelial / HPF NONE SEEN 0 - 5 /HPF    Comment: Performed at [facility], [address] [place], [place] [address]   DG Chest Port 1 View Result Date: 10/13/2024 EXAM: 1 VIEW(S) XRAY OF THE CHEST 10/13/2024 06:31:00 PM COMPARISON: 09/30/2024 CLINICAL HISTORY: sob FINDINGS: LUNGS AND PLEURA: No focal pulmonary opacity. No pulmonary edema.  No pleural effusion. No pneumothorax. HEART AND MEDIASTINUM: No acute abnormality of the cardiac and mediastinal silhouettes. BONES AND SOFT TISSUES: No acute osseous abnormality. IMPRESSION: 1. No acute cardiopulmonary process. Electronically signed by: Pinkie Pebbles MD 10/13/2024 07:00 PM EST RP Workstation: [id number]    Pending Labs Unresulted Labs (  From admission, onward)     Start     Ordered   10/13/24 1821  Lactic acid, plasma  (Lactic Acid)  STAT Now then every 2 hours,   R (with STAT occurrences)      10/13/24 1820   10/13/24 1821  Blood culture (routine x 2)  BLOOD CULTURE X 2,   STAT      10/13/24 1820            Vitals/Pain Today's Vitals   10/13/24 1808 10/13/24 1826 10/13/24 1830 10/13/24 1836  BP: (!) 174/73   135/77  Pulse: (!) 57  (!) 54 (!) 52  Resp: 20  19 17   Temp: 97.8 F (36.6 C)     TempSrc: Oral     SpO2: 98%  97% 95%  Weight:      Height:      PainSc:  0-No pain      Isolation Precautions No active isolations  Medications Medications  furosemide (LASIX) injection 20 mg (20 mg Intravenous Given 10/13/24 1930)    Mobility walks     Focused Assessments Cardiac Assessment Handoff:  Cardiac Rhythm: Atrial fibrillation No results found for: CKTOTAL, CKMB, CKMBINDEX, TROPONINI No results found for: DDIMER Does the Patient currently have chest pain? No    R Recommendations: See Admitting Provider Note  Report given to:   Additional Notes:

## 2024-10-13 NOTE — Progress Notes (Signed)
 Patient transferred from [facility] and admitted to [facility] room 19. Patient arrived via carelink at 2113 accompanied by [facility] RN and Paramedic; report received from carelink RN and all questions answered. Upon arrival patient is A&O x4, ambulatory, and free of pain at this time. connected to the monitor and full set of VS were taken. See EMR for initial VS.

## 2024-10-13 NOTE — ED Provider Notes (Signed)
 [facility] Provider Note   CSN: [id number] Arrival date & time: 10/13/24  1758     Patient presents with: Shortness of Breath   Brandon Burns is a 78 y.o. male.   78 yo M with a chief point of difficulty breathing.  The patient unfortunately has been having issues for about 3 weeks now.  Coughing up it has been seen by multiple providers including this emergency department.  Has had chest imaging CT imaging currently has a cardiac monitor in place and was recently diagnosed with atrial fibrillation.  Has taken about 3 doses of Eliquis.  Today he was trying to remove a tree that had fallen across his driveway and was very short of breath doing it had to stop multiple times.  He was very fatigued with minimal exertion.  Denies any specific chest pain.  Has had some lower extremity edema which he feels is chronic.   Shortness of Breath      Prior to Admission medications   Medication Sig Start Date End Date Taking? Authorizing Provider  acetaminophen  (TYLENOL ) 500 MG tablet Take 500 mg by mouth as needed.    [provider]  amLODipine (NORVASC) 2.5 MG tablet Take 2.5 mg by mouth daily.    [provider]  apixaban (ELIQUIS) 5 MG TABS tablet Take 1 tablet (5 mg total) by mouth 2 (two) times daily. 10/04/24 11/03/24  Fenton, Clint R, PA  Brimonidine Tartrate (LUMIFY) 0.025 % SOLN Place 1 drop into both eyes daily.    [provider]  Cholecalciferol (VITAMIN D3) 50 MCG (2000 UT) TABS Take 2,000 Units by mouth daily.    [provider]  ezetimibe  (ZETIA ) 10 MG tablet Take 1 tablet (10 mg total) by mouth daily. 07/02/24 10/04/24  Acharya, Gayatri A, MD  omeprazole (PRILOSEC OTC) 20 MG tablet Take 20 mg by mouth daily before breakfast.    [provider]  omeprazole (PRILOSEC) 20 MG capsule Take 20 mg by mouth daily.    [provider]  omeprazole (PRILOSEC) 40 MG capsule Take 40 mg by mouth  every morning. 08/08/24   [provider]  tiZANidine  (ZANAFLEX ) 4 MG tablet Take 4 mg by mouth at bedtime.    [provider]  vitamin B-12 (CYANOCOBALAMIN) 500 MCG tablet Take 500 mcg by mouth daily.    [provider]  zolpidem  (AMBIEN ) 10 MG tablet Take 10 mg by mouth at bedtime.    [provider]    Allergies: Atorvastatin, Pravastatin, and Rosuvastatin     Review of Systems  Respiratory:  Positive for shortness of breath.     Updated Vital Signs BP (!) 145/76   Pulse (!) 51   Temp 97.8 F (36.6 C) (Oral)   Resp 15   Ht 6' 1 (1.854 m)   Wt 103.6 kg   SpO2 97%   BMI 30.13 kg/m   Physical Exam Vitals and nursing note reviewed.  Constitutional:      Appearance: He is well-developed.  HENT:     Head: Normocephalic and atraumatic.  Eyes:     Pupils: Pupils are equal, round, and reactive to light.  Neck:     Vascular: JVD (mid neck) present.  Cardiovascular:     Rate and Rhythm: Normal rate and regular rhythm.     Heart sounds: No murmur heard.    No friction rub. No gallop.  Pulmonary:     Effort: No respiratory distress.  Breath sounds: No wheezing, rhonchi or rales.  Abdominal:     General: There is no distension.     Tenderness: There is no abdominal tenderness. There is no guarding or rebound.  Musculoskeletal:        General: Normal range of motion.     Cervical back: Normal range of motion and neck supple.  Skin:    Coloration: Skin is not pale.     Findings: No rash.  Neurological:     Mental Status: He is alert and oriented to person, place, and time.  Psychiatric:        Behavior: Behavior normal.     (all labs ordered are listed, but only abnormal results are displayed) Labs Reviewed  BASIC METABOLIC PANEL WITH GFR - Abnormal; Notable for the following components:      Result Value   Glucose, Bld 120 (*)    Creatinine, Ser 1.37 (*)    GFR, Estimated 53 (*)    All other components within normal limits  CBC  - Abnormal; Notable for the following components:   WBC 11.8 (*)    HCT 38.6 (*)    All other components within normal limits  PRO BRAIN NATRIURETIC PEPTIDE - Abnormal; Notable for the following components:   Pro Brain Natriuretic Peptide 3,179.0 (*)    All other components within normal limits  HEPATIC FUNCTION PANEL - Abnormal; Notable for the following components:   Total Protein 6.4 (*)    Bilirubin, Direct 0.3 (*)    All other components within normal limits  URINALYSIS, ROUTINE W REFLEX MICROSCOPIC - Abnormal; Notable for the following components:   Hgb urine dipstick TRACE (*)    All other components within normal limits  URINALYSIS, MICROSCOPIC (REFLEX) - Abnormal; Notable for the following components:   Bacteria, UA RARE (*)    All other components within normal limits  TROPONIN T, HIGH SENSITIVITY - Abnormal; Notable for the following components:   Troponin T High Sensitivity 47 (*)    All other components within normal limits  CULTURE, BLOOD (ROUTINE X 2)  CULTURE, BLOOD (ROUTINE X 2)  LIPASE, BLOOD  LACTIC ACID, PLASMA  LACTIC ACID, PLASMA    EKG: EKG Interpretation Date/Time:  Sunday October 13 2024 18:07:13 EST Ventricular Rate:  56 PR Interval:  189 QRS Duration:  97 QT Interval:  447 QTC Calculation: 432 R Axis:   75  Text Interpretation: Sinus rhythm Minimal ST depression, diffuse leads Baseline wander in lead(s) V1 No significant change since last tracing Confirmed by Emil Share [id number]) on 10/13/2024 6:08:50 PM  Radiology: [facility] Chest Port 1 View Result Date: 10/13/2024 EXAM: 1 VIEW(S) XRAY OF THE CHEST 10/13/2024 06:31:00 PM COMPARISON: 09/30/2024 CLINICAL HISTORY: sob FINDINGS: LUNGS AND PLEURA: No focal pulmonary opacity. No pulmonary edema. No pleural effusion. No pneumothorax. HEART AND MEDIASTINUM: No acute abnormality of the cardiac and mediastinal silhouettes. BONES AND SOFT TISSUES: No acute osseous abnormality. IMPRESSION: 1. No acute cardiopulmonary  process. Electronically signed by: Pinkie Pebbles MD 10/13/2024 07:00 PM EST RP Workstation: [id number]     Procedures   Medications Ordered in the ED  furosemide (LASIX) injection 20 mg (20 mg Intravenous Given 10/13/24 1930)                                    Medical Decision Making Amount and/or Complexity of Data Reviewed Labs: ordered. Radiology: ordered.  Risk Prescription drug management. Decision  regarding hospitalization.   78 yo M with a cc of shortness of breath and fatigue.  This has been going on for about 3 weeks but worsening severely today.  Shortness of breath with exertion.  Has been coughing a little bit had some lower extremity edema though not drastically changed from baseline.  Will obtain a laboratory evaluation here.  Chest x-ray.  On record review about a week ago patient was seen in this emergency department with similar.  Had CT imaging with a bronchitis type picture.  No acute anemia.  Mild leukocytosis no significant electrolyte abnormalities.  Renal function appears to be at baseline.  Troponins negative.  BNP is elevated.  I wonder if the patient has new onset heart failure.  3 weeks of cough he has had some orthopnea and PND.  Lower extremity edema that he tells me this is chronic.  Will give a bolus dose of Lasix here.  Will discuss with cardiology.  Discussed case with Dr. Otelia, cardiology fellow.  Did feel reasonable to bring the patient in for overnight diuresis.  Cards to see in the morning.  The patients results and plan were reviewed and discussed.   Any x-rays performed were independently reviewed by myself.   Differential diagnosis were considered with the presenting HPI.  Medications  furosemide (LASIX) injection 20 mg (20 mg Intravenous Given 10/13/24 1930)    Vitals:   10/13/24 1836 10/13/24 1845 10/13/24 1900 10/13/24 1915  BP: 135/77 130/63 (!) 140/66 (!) 145/76  Pulse: (!) 52 (!) 52 (!) 51 (!) 51  Resp: 17 (!) 21 18 15    Temp:      TempSrc:      SpO2: 95% 96% 96% 97%  Weight:      Height:        Final diagnoses:  Acute systolic congestive heart failure (HCC)    Admission/ observation were discussed with the admitting physician, patient and/or family and they are comfortable with the plan.       Final diagnoses:  Acute systolic congestive heart failure [facility])    ED Discharge Orders     None          Emil Share, DO 10/13/24 2141

## 2024-10-13 NOTE — H&P (Incomplete)
 History and Physical    Brandon Burns FMW:[id number] DOB: March 01, 1946 DOA: 10/13/2024  PCP: Millicent Sharper, MD   Patient coming from: Home   Chief Complaint: DOE, fatigue   HPI: Brandon Burns is a 78 y.o. male with medical history significant for hypertension, OSA, CKD 3A, insomnia, and recent atrial fibrillation diagnosis who presents with fatigue and exertional dyspnea.  Patient developed cough and congestion last month, was diagnosed with bronchitis,  [facility] ED Course: Upon arrival to the ED, patient is found to be ***  Review of Systems:  All other systems reviewed and apart from HPI, are negative.  Past Medical History:  Diagnosis Date  . Aortic atherosclerosis 09/02/2021  . B12 deficiency 02/17/2021  . Benign prostatic hyperplasia with urinary frequency 04/20/2022  . CKD (chronic kidney disease)   . Gastroesophageal reflux disease 09/25/2019  . Hypertension   . 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/25/2019  . Kidney stone   . 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 Lumbar degenerative disc disease 04/18/2017  . Mixed hyperlipidemia 09/25/2019  . Stage 3 chronic kidney disease (HCC) 01/06/2018  . Vitamin D deficiency 12/05/2020    Past Surgical History:  Procedure Laterality Date  . BACK SURGERY    . CHOLECYSTECTOMY    . HEMORRHOID SURGERY    . LEFT HEART CATH AND CORONARY ANGIOGRAPHY N/A 10/19/2023   Procedure: LEFT HEART CATH AND CORONARY ANGIOGRAPHY;  Surgeon: Verlin Lonni BIRCH, MD;  Location: [facility]AS[facility];  Service: Cardiovascular;  Laterality: N/A;  . 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    Social History:   reports that he has never smoked. He has never used smokeless tobacco. He reports that he does not drink alcohol and does not use drugs.  Allergies  Allergen Reactions  . Atorvastatin Other (See Comments)  . Pravastatin Other (See Comments)  . Rosuvastatin  Other (See Comments)    History reviewed. No pertinent family history.   Prior  to Admission medications   Medication Sig Start Date End Date Taking? Authorizing Provider  acetaminophen  (TYLENOL ) 500 MG tablet Take 500 mg by mouth as needed.    [provider]  amLODipine (NORVASC) 2.5 MG tablet Take 2.5 mg by mouth daily.    [provider]  apixaban (ELIQUIS) 5 MG TABS tablet Take 1 tablet (5 mg total) by mouth 2 (two) times daily. 10/04/24 11/03/24  Fenton, Clint R, PA  Brimonidine Tartrate (LUMIFY) 0.025 % SOLN Place 1 drop into both eyes daily.    [provider]  Cholecalciferol (VITAMIN D3) 50 MCG (2000 UT) TABS Take 2,000 Units by mouth daily.    [provider]  ezetimibe  (ZETIA ) 10 MG tablet Take 1 tablet (10 mg total) by mouth daily. 07/02/24 10/04/24  Acharya, Gayatri A, MD  omeprazole (PRILOSEC OTC) 20 MG tablet Take 20 mg by mouth daily before breakfast.    [provider]  omeprazole (PRILOSEC) 20 MG capsule Take 20 mg by mouth daily.    [provider]  omeprazole (PRILOSEC) 40 MG capsule Take 40 mg by mouth every morning. 08/08/24   [provider]  tiZANidine  (ZANAFLEX ) 4 MG tablet Take 4 mg by mouth at bedtime.    [provider]  vitamin B-12 (CYANOCOBALAMIN) 500 MCG tablet Take 500 mcg by mouth daily.    [provider]  zolpidem  (AMBIEN ) 10 MG tablet Take 10 mg by mouth at bedtime.    [provider]    Physical Exam: Vitals:   10/13/24 1915  10/13/24 2125 10/13/24 2143 10/13/24 2200  BP: (!) 145/76 (!) 142/72    Pulse: (!) 51 (!) 59    Resp: 15 (!) 21  16  Temp:  (!) 97.5 F (36.4 C)    TempSrc:  Oral    SpO2: 97% 98%    Weight:   102.6 kg   Height:   6' 1 (1.854 m)      Constitutional: NAD, calm  Eyes: PERTLA, lids and conjunctivae normal ENMT: Mucous membranes are moist. Posterior pharynx clear of any exudate or lesions.   Neck: supple, no masses  Respiratory: clear to auscultation bilaterally, no wheezing, no crackles. No accessory muscle use.   Cardiovascular: S1 & S2 heard, regular rate and rhythm. No extremity edema. No significant JVD. Abdomen: No distension, no tenderness, soft. Bowel sounds active.  Musculoskeletal: no clubbing / cyanosis. No joint deformity upper and lower extremities.   Skin: no significant rashes, lesions, ulcers. Warm, dry, well-perfused. Neurologic: CN 2-12 grossly intact. Sensation intact, DTR normal. Strength 5/5 in all 4 limbs. Alert and oriented.  Psychiatric: Pleasant. Cooperative.    Labs and Imaging on Admission: I have personally reviewed following labs and imaging studies  CBC: Recent Labs  Lab 10/13/24 1811  WBC 11.8*  HGB 13.2  HCT 38.6*  MCV 87.7  PLT 186   Basic Metabolic Panel: Recent Labs  Lab 10/13/24 1811  NA 139  K 4.0  CL 104  CO2 23  GLUCOSE 120*  BUN 18  CREATININE 1.37*  CALCIUM  9.1   GFR: Estimated Creatinine Clearance: 56.8 mL/min (A) (by C-G formula based on SCr of 1.37 mg/dL (H)). Liver Function Tests: Recent Labs  Lab 10/13/24 1811  AST 25  ALT 37  ALKPHOS 72  BILITOT 0.6  PROT 6.4*  ALBUMIN 4.0   Recent Labs  Lab 10/13/24 1811  LIPASE 19   No results for input(s): AMMONIA in the last 168 hours. Coagulation Profile: No results for input(s): INR, PROTIME in the last 168 hours. Cardiac Enzymes: No results for input(s): CKTOTAL, CKMB, CKMBINDEX, TROPONINI in the last 168 hours. BNP (last 3 results) Recent Labs    10/13/24 1811  PROBNP 3,179.0*   HbA1C: No results for input(s): HGBA1C in the last 72 hours. CBG: No results for input(s): GLUCAP in the last 168 hours. Lipid Profile: No results for input(s): CHOL, HDL, LDLCALC, TRIG, CHOLHDL, LDLDIRECT in the last 72 hours. Thyroid Function Tests: No results for input(s): TSH, T4TOTAL, FREET4, T3FREE, THYROIDAB in the last 72 hours. Anemia Panel: No results for input(s): VITAMINB12, FOLATE, FERRITIN, TIBC, IRON, RETICCTPCT in the last 72  hours. Urine analysis:    Component Value Date/Time   COLORURINE YELLOW 10/13/2024 1811   APPEARANCEUR CLEAR 10/13/2024 1811   LABSPEC 1.020 10/13/2024 1811   PHURINE 5.5 10/13/2024 1811   GLUCOSEU NEGATIVE 10/13/2024 1811   HGBUR TRACE (A) 10/13/2024 1811   BILIRUBINUR NEGATIVE 10/13/2024 1811   BILIRUBINUR negative 02/08/2023 0000   KETONESUR NEGATIVE 10/13/2024 1811   PROTEINUR NEGATIVE 10/13/2024 1811   UROBILINOGEN 0.2 02/08/2023 0000   UROBILINOGEN 0.2 06/24/2012 1535   NITRITE NEGATIVE 10/13/2024 1811   LEUKOCYTESUR NEGATIVE 10/13/2024 1811   Sepsis Labs: @LABRCNTIP (procalcitonin:4,lacticidven:4) )No results found for this or any previous visit (from the past 240 hours).   Radiological Exams on Admission: DG Chest Port 1 View Result Date: 10/13/2024 EXAM: 1 VIEW(S) XRAY OF THE CHEST 10/13/2024 06:31:00 PM COMPARISON: 09/30/2024 CLINICAL HISTORY: sob FINDINGS: LUNGS AND PLEURA: No focal pulmonary opacity. No pulmonary edema.  No pleural effusion. No pneumothorax. HEART AND MEDIASTINUM: No acute abnormality of the cardiac and mediastinal silhouettes. BONES AND SOFT TISSUES: No acute osseous abnormality. IMPRESSION: 1. No acute cardiopulmonary process. Electronically signed by: Pinkie Pebbles MD 10/13/2024 07:00 PM EST RP Workstation: [id number]    EKG: Independently reviewed. Atrial fibrillation, rate 56.   Assessment/Plan   1. Atrial fibrillation  - Patient has been experiencing increased fatigue and DOE since a fib diagnosis last month  - Appreciate cardiology consultation, planning for TTE in am and possible cardioversion  - Continue Eliquis, keep NPO after midnight   2. Hypertension   - Continue Norvasc     3. CKD 3A  - Appears close to baseline  - Renally-dose medications    4. CAD  - No anginal symptoms     DVT prophylaxis: Eliquis  Code Status: Full  Level of Care: Level of care: Telemetry Family Communication: Wife at bedside  Disposition Plan:   Patient is from: Home  Anticipated d/c is to: Home  Anticipated d/c date is: 10/16/24  Patient currently: Pending TTE, possible cardioversion  Consults called: Cardiology  Admission status: Inpatient     Evalene GORMAN Sprinkles, MD [facility]  10/13/2024, 11:56 PM

## 2024-10-13 NOTE — ED Notes (Signed)
 [facility] called for transport.

## 2024-10-13 NOTE — Consult Note (Signed)
 Cardiology Consultation   Patient ID: Brandon Burns MRN: [id number]; DOB: [birth year]  Admit date: 10/13/2024 Date of Consult: 10/13/2024  PCP:  Millicent Sharper, MD   [facility] Providers Cardiologist:  Soyla DELENA Merck, MD        Patient Profile: Brandon Burns is a 78 y.o. male with a hx of non-obstructive CAD, HLD, HTN, AF, and OSA who is being seen 10/13/2024 for the evaluation of shortness of breath.  History of Present Illness: [name] was recently diagnosed with AF following an ED visit on 10/27 for cough, which was presumed to be related to bronchitis and was discharged on antibiotics. During that visit, he was found to be in AF, which was a new diagnosis for him, and apixaban was prescribed.   Since discharge, cough had improved; however, Mr.  Fomby reports that he's been having exertional shortness of breath that has been getting worse over the past 2-3 weeks. No associated PND, orthopnea, leg swelling, chest pain, palpitations, lightheadedness, pre-syncope, or syncope. No weight changes. Today, he was trying to move a tree that had fallen across the driveway of his house; however, he get extremely short of breath, which is unusual for him, and subsequently presented to the ED at [facility].   He reports that he has not started taking apixaban yet as he was hoping he could avoid AC.   At the the ED at [facility], his work-up showed Cr 1.37 (at baseline), proBNP 3179, troponin 47, CXR with no pulmonary edema or acute processes. EKG with AF with slow ventricular response (rate 57). Given elevated proBNP and concern for new-onset HF, he was given Lasix 20 mg IV.  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   Lumbar degenerative disc disease 04/18/2017   Mixed hyperlipidemia 09/25/2019   Stage 3 chronic kidney disease (HCC) 01/06/2018   Vitamin D deficiency 12/05/2020    Past Surgical History:  Procedure Laterality Date   BACK SURGERY     CHOLECYSTECTOMY     HEMORRHOID SURGERY     LEFT HEART CATH AND CORONARY ANGIOGRAPHY N/A 10/19/2023   Procedure: LEFT HEART CATH AND CORONARY ANGIOGRAPHY;  Surgeon: Verlin Lonni BIRCH, MD;  Location: [facility] INVASIVE CV LAB;  Service: Cardiovascular;  Laterality: N/A;   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     Home Medications:  Prior to Admission medications   Medication Sig Start Date End Date Taking? Authorizing Provider  acetaminophen  (TYLENOL ) 500 MG tablet Take 500 mg by mouth as needed.    [provider]  amLODipine (NORVASC) 2.5 MG tablet Take 2.5 mg by mouth daily.    [provider]  apixaban (ELIQUIS) 5 MG TABS tablet Take 1 tablet (5 mg total) by mouth 2 (two) times daily. 10/04/24 11/03/24  Fenton, Clint R, PA  Brimonidine Tartrate (LUMIFY) 0.025 % SOLN Place 1 drop into both eyes daily.    [provider]  Cholecalciferol (VITAMIN D3) 50 MCG (2000 UT) TABS Take 2,000 Units by mouth daily.    [provider]  ezetimibe  (ZETIA ) 10 MG tablet Take 1 tablet (10 mg total) by mouth daily. 07/02/24 10/04/24  Acharya, Gayatri A,  MD  omeprazole (PRILOSEC OTC) 20 MG tablet Take 20 mg by mouth daily before breakfast.    [provider]  omeprazole (PRILOSEC) 20 MG capsule Take 20 mg by mouth daily.    [provider]  omeprazole (PRILOSEC) 40 MG capsule Take 40 mg by mouth every morning. 08/08/24   [provider]  tiZANidine  (ZANAFLEX ) 4 MG tablet Take 4 mg by mouth at bedtime.    [provider]  vitamin B-12 (CYANOCOBALAMIN) 500 MCG tablet Take 500 mcg by mouth daily.    [provider]   zolpidem  (AMBIEN ) 10 MG tablet Take 10 mg by mouth at bedtime.    [provider]    Scheduled Meds:  Continuous Infusions:  PRN Meds:   Allergies:    Allergies  Allergen Reactions   Atorvastatin Other (See Comments)   Pravastatin Other (See Comments)   Rosuvastatin  Other (See Comments)    Social History:   Social History   Socioeconomic History   Marital status: Widowed    Spouse name: Not on file   Number of children: Not on file   Years of education: Not on file   Highest education level: Not on file  Occupational History   Not on file  Tobacco Use   Smoking status: Never   Smokeless tobacco: Never  Vaping Use   Vaping status: Never Used  Substance and Sexual Activity   Alcohol use: No   Drug use: No   Sexual activity: Yes    Partners: Female  Other Topics Concern   Not on file  Social History Narrative   Not on file   Social Drivers of Health   Financial Resource Strain: Not on file  Food Insecurity: Low Risk  (05/24/2023)   Received from [facility]   Hunger Vital Sign    Within the past 12 months, you worried that your food would run out before you got money to buy more: Never true    Within the past 12 months, the food you bought just didn't last and you didn't have money to get more. : Never true  Transportation Needs: Not on file (05/24/2023)  Physical Activity: Not on file  Stress: Not on file  Social Connections: Not on file  Intimate Partner Violence: Not At Risk (01/26/2023)   Humiliation, Afraid, Rape, and Kick questionnaire    Fear of Current or Ex-Partner: No    Emotionally Abused: No    Physically Abused: No    Sexually Abused: No    Family History:   History reviewed. No pertinent family history.   ROS:  Please see the history of present illness.   All other ROS reviewed and negative.     Physical Exam/Data: Vitals:   10/13/24 1836 10/13/24 1845 10/13/24 1900 10/13/24 1915  BP: 135/77 130/63 (!) 140/66 (!) 145/76   Pulse: (!) 52 (!) 52 (!) 51 (!) 51  Resp: 17 (!) 21 18 15   Temp:      TempSrc:      SpO2: 95% 96% 96% 97%  Weight:      Height:       No intake or output data in the 24 hours ending 10/13/24 2107    10/13/2024    6:05 PM 10/04/2024   10:58 AM 09/30/2024   11:50 AM  Last 3 Weights  Weight (lbs) 228 lb 6.4 oz 228 lb 6.4 oz 228 lb  Weight (kg) 103.602 kg 103.602 kg 103.42 kg     Body mass index  is 30.13 kg/m.  General:  Well nourished, well developed, in no acute distress HEENT: normal Neck: no JVD Vascular: Distal pulses 2+ bilaterally Cardiac:  normal S1, S2; irregular rhythm; no murmur  Lungs:  clear to auscultation bilaterally, no wheezing, rhonchi or rales  Abd: soft, nontender, no hepatomegaly  Ext: trace lower extremity edema  Musculoskeletal:  No deformities, BUE and BLE strength normal and equal Skin: warm and dry  Neuro:  CNs 2-12 intact, no focal abnormalities noted Psych:  Normal affect   EKG:  The EKG was personally reviewed and demonstrates:  AF with slow ventricular response (ventricular rate 56)  Relevant CV Studies:  TTE 06/2023:  1. Left ventricular ejection fraction, by estimation, is 55 to 60%. The  left ventricle has normal function. The left ventricle has no regional  wall motion abnormalities. There is mild concentric left ventricular  hypertrophy. Left ventricular diastolic  parameters were normal.   2. Right ventricular systolic function is normal. The right ventricular  size is normal.   3. The mitral valve is normal in structure. No evidence of mitral valve  regurgitation. No evidence of mitral stenosis.   4. The aortic valve is normal in structure. Aortic valve regurgitation is  trivial. No aortic stenosis is present.   5. The inferior vena cava is normal in size with greater than 50%  respiratory variability, suggesting right atrial pressure of 3 mmHg.   LHC 10/2023:   Mid RCA lesion is 20% stenosed.   Mid LAD lesion is 20% stenosed.      Laboratory Data: High Sensitivity Troponin:  No results for input(s): TROPONINIHS in the last 720 hours.   Chemistry Recent Labs  Lab 10/13/24 1811  NA 139  K 4.0  CL 104  CO2 23  GLUCOSE 120*  BUN 18  CREATININE 1.37*  CALCIUM  9.1  GFRNONAA 53*  ANIONGAP 12    Recent Labs  Lab 10/13/24 1811  PROT 6.4*  ALBUMIN 4.0  AST 25  ALT 37  ALKPHOS 72  BILITOT 0.6   Lipids No results for input(s): CHOL, TRIG, HDL, LABVLDL, LDLCALC, CHOLHDL in the last 168 hours.  Hematology Recent Labs  Lab 10/13/24 1811  WBC 11.8*  RBC 4.40  HGB 13.2  HCT 38.6*  MCV 87.7  MCH 30.0  MCHC 34.2  RDW 14.2  PLT 186   Thyroid No results for input(s): TSH, FREET4 in the last 168 hours.  BNP Recent Labs  Lab 10/13/24 1811  PROBNP 3,179.0*    DDimer No results for input(s): DDIMER in the last 168 hours.  Radiology/Studies:  DG Chest Port 1 View Result Date: 10/13/2024 EXAM: 1 VIEW(S) XRAY OF THE CHEST 10/13/2024 06:31:00 PM COMPARISON: 09/30/2024 CLINICAL HISTORY: sob FINDINGS: LUNGS AND PLEURA: No focal pulmonary opacity. No pulmonary edema. No pleural effusion. No pneumothorax. HEART AND MEDIASTINUM: No acute abnormality of the cardiac and mediastinal silhouettes. BONES AND SOFT TISSUES: No acute osseous abnormality. IMPRESSION: 1. No acute cardiopulmonary process. Electronically signed by: Pinkie Pebbles MD 10/13/2024 07:00 PM EST RP Workstation: [id number]     Assessment and Plan: AF Shortness of breath Presents with progressive worsening of dyspnea for 3 weeks in the setting of a recent diagnosis of AF. Rates have been controlled. He was prescribed apixaban, but he has not started taking it yet as he was hoping he could avoid taking AC. ProBNP 3179. CXR with no pulmonary congestion or edema. JVP appears normal with no signs of HF on my examination upon arrival (of note  he had received Lasix 20 mg IV in the ED at [facility] with several urine occurrence since  then). Overall, the patient does not appear to be significantly volume overloaded and he appears to be close to euvolemia. Suspect that the shortness of breath and significant proBNP elevation are more related to AF than HF.  - Start apixban 5 mg BID; we discussed the importance of adherence to decrease the risk of stroke  - TTE in the AM - Given symptomatic AF, will keep him NPO after midnight for consideration of TEE/DCCV   Risk Assessment/Risk Scores:         CHA2DS2-VASc Score = 4   This indicates a 4.8% annual risk of stroke. The patient's score is based upon: CHF History: 0 HTN History: 1 Diabetes History: 0 Stroke History: 0 Vascular Disease History: 1 Age Score: 2 Gender Score: 0        For questions or updates, please contact [facility] Please consult [email or web address] for contact info under      Signed, Gillian CHRISTELLA Cass, MD  10/13/2024 9:07 PM

## 2024-10-13 NOTE — Progress Notes (Signed)
 Patient is admitted from [facility] ED to [facility] telemetry bed for likely new onset heart failure.  Patient with history of paroxysmal atrial fibrillation on Eliquis, essential hypertension, GERD, stage III CKD and hearing loss.  He had 3 weeks of progressive shortness of breath, orthopnea, PND.  He has elevated BNP here. Not requiring O2.  EDP spoke with cards fellow.  Cards to see in the morning.

## 2024-10-13 NOTE — ED Triage Notes (Signed)
 States was found to be in A.fib on 10/27, started on eliquis. Has been short of breath since. Today has been increasingly short of breath on exertion. Hypertensive at home. Denies chest pain.

## 2024-10-13 NOTE — H&P (Signed)
 SABRA

## 2024-10-14 ENCOUNTER — Inpatient Hospital Stay (HOSPITAL_COMMUNITY)

## 2024-10-14 DIAGNOSIS — I4891 Unspecified atrial fibrillation: Secondary | ICD-10-CM | POA: Diagnosis not present

## 2024-10-14 DIAGNOSIS — N1831 Chronic kidney disease, stage 3a: Secondary | ICD-10-CM | POA: Diagnosis not present

## 2024-10-14 DIAGNOSIS — R0609 Other forms of dyspnea: Secondary | ICD-10-CM

## 2024-10-14 LAB — BASIC METABOLIC PANEL WITH GFR
Anion gap: 8 (ref 5–15)
BUN: 16 mg/dL (ref 8–23)
CO2: 25 mmol/L (ref 22–32)
Calcium: 8.9 mg/dL (ref 8.9–10.3)
Chloride: 106 mmol/L (ref 98–111)
Creatinine, Ser: 1.45 mg/dL — ABNORMAL HIGH (ref 0.61–1.24)
GFR, Estimated: 50 mL/min — ABNORMAL LOW
Glucose, Bld: 118 mg/dL — ABNORMAL HIGH (ref 70–99)
Potassium: 3.6 mmol/L (ref 3.5–5.1)
Sodium: 139 mmol/L (ref 135–145)

## 2024-10-14 LAB — ECHOCARDIOGRAM COMPLETE
AR max vel: 2.01 cm2
AV Area VTI: 2.25 cm2
AV Area mean vel: 2.22 cm2
AV Mean grad: 4 mmHg
AV Peak grad: 8.3 mmHg
Ao pk vel: 1.44 m/s
Area-P 1/2: 3.42 cm2
Calc EF: 75.6 %
Height: 73 in
MV VTI: 2.06 cm2
S' Lateral: 2.6 cm
Single Plane A2C EF: 78.5 %
Single Plane A4C EF: 75.1 %
Weight: 3619.2 [oz_av]

## 2024-10-14 LAB — CBC
HCT: 36.7 % — ABNORMAL LOW (ref 39.0–52.0)
Hemoglobin: 12.5 g/dL — ABNORMAL LOW (ref 13.0–17.0)
MCH: 29.9 pg (ref 26.0–34.0)
MCHC: 34.1 g/dL (ref 30.0–36.0)
MCV: 87.8 fL (ref 80.0–100.0)
Platelets: 177 K/uL (ref 150–400)
RBC: 4.18 MIL/uL — ABNORMAL LOW (ref 4.22–5.81)
RDW: 14.1 % (ref 11.5–15.5)
WBC: 10.7 K/uL — ABNORMAL HIGH (ref 4.0–10.5)
nRBC: 0 % (ref 0.0–0.2)

## 2024-10-14 LAB — MAGNESIUM: Magnesium: 1.8 mg/dL (ref 1.7–2.4)

## 2024-10-14 MED ORDER — MAGNESIUM SULFATE IN D5W 1-5 GM/100ML-% IV SOLN
1.0000 g | Freq: Once | INTRAVENOUS | Status: AC
  Administered 2024-10-14: 1 g via INTRAVENOUS
  Filled 2024-10-14 (×2): qty 100

## 2024-10-14 MED ORDER — SODIUM CHLORIDE 0.9 % IV SOLN: INTRAVENOUS | Status: DC

## 2024-10-14 MED ORDER — PERFLUTREN LIPID MICROSPHERE
1.0000 mL | INTRAVENOUS | Status: AC | PRN
  Administered 2024-10-14: 2 mL via INTRAVENOUS

## 2024-10-14 MED ORDER — POTASSIUM CHLORIDE CRYS ER 20 MEQ PO TBCR
20.0000 meq | EXTENDED_RELEASE_TABLET | Freq: Once | ORAL | Status: AC
  Administered 2024-10-14: 20 meq via ORAL

## 2024-10-14 NOTE — Progress Notes (Signed)
   10/14/24 1205  Assess: MEWS Score  Temp 98.5 F (36.9 C)  BP 116/61  MAP (mmHg) 75  Pulse Rate 64  ECG Heart Rate (!) 48  Resp 18  Level of Consciousness Alert  SpO2 95 %  O2 Device Room Air  Assess: MEWS Score  MEWS Temp 0  MEWS Systolic 0  MEWS Pulse 1  MEWS RR 0  MEWS LOC 0  MEWS Score 1  MEWS Score Color Green  Assess: if the MEWS score is Yellow or Red  Were vital signs accurate and taken at a resting state? No, vital signs rechecked  Assess: SIRS CRITERIA  SIRS Temperature  0  SIRS Respirations  0  SIRS Pulse 0  SIRS WBC 0  SIRS Score Sum  0   MD aware, Low A-Fib planning to get Cardioversion 10/15/24

## 2024-10-14 NOTE — Progress Notes (Signed)
  Echocardiogram 2D Echocardiogram has been performed.  Brandon Burns 10/14/2024, 1:26 PM

## 2024-10-14 NOTE — Progress Notes (Signed)
 [facility] PROGRESS NOTE    Progress Note  Brandon Burns  FMW:[id number] DOB: 25-May-1946 DOA: 10/13/2024 PCP: Millicent Sharper, MD     Brief Narrative:   Brandon Burns is an 78 y.o. male past medical history significant for essential hypertension, struct of sleep apnea, chronic kidney disease stage III AA recently diagnosed with atrial fibrillation on 09/30/2024 comes in with fatigue and exertional dyspnea, recently seen treated for acute bronchitis with steroids and antibiotics with no improvement   Assessment/Plan:   Atrial fibrillation with slow ventricular response (HCC) A-fib with slow ventricular response.  Symptomatic. Cardiology consulted for possible TEE and DCCV. Continue Eliquis.  n.p.o. after midnight. Currently on no AV nodal blocking agents  Essential hypertension Continue Norvasc.  CKD stage 3a, GFR 45-59 ml/min (HCC) At baseline.  CAD (coronary artery disease) No anginal symptoms.   DVT prophylaxis: Eliquis Family Communication:wife Status is: Inpatient Remains inpatient appropriate because: A-fib with RVR    Code Status:     Code Status Orders  (From admission, onward)           Start     Ordered   10/13/24 2235  Full code  Continuous       Question:  By:  Answer:  Consent: discussion documented in EHR   10/13/24 2235           Code Status History     Date Active Date Inactive Code Status Order ID Comments User Context   10/19/2023 0831 10/19/2023 1602 Full Code [id number]  Verlin Lonni BIRCH, MD Inpatient   01/26/2023 1810 01/28/2023 1845 Full Code [id number]  Alfornia Madison, MD Inpatient         IV Access:   Peripheral IV   Procedures and diagnostic studies:   DG Chest Port 1 View Result Date: 10/13/2024 EXAM: 1 VIEW(S) XRAY OF THE CHEST 10/13/2024 06:31:00 PM COMPARISON: 09/30/2024 CLINICAL HISTORY: sob FINDINGS: LUNGS AND PLEURA: No focal pulmonary opacity. No pulmonary edema. No pleural effusion. No  pneumothorax. HEART AND MEDIASTINUM: No acute abnormality of the cardiac and mediastinal silhouettes. BONES AND SOFT TISSUES: No acute osseous abnormality. IMPRESSION: 1. No acute cardiopulmonary process. Electronically signed by: Pinkie Pebbles MD 10/13/2024 07:00 PM EST RP Workstation: [id number]     Medical Consultants:   None.   Subjective:    Brandon Burns no complaints  Objective:    Vitals:   10/14/24 0515 10/14/24 0530 10/14/24 0545 10/14/24 0600  BP: (!) 91/34 (!) 99/35 (!) 102/41 (!) 119/54  Pulse:      Resp: 19 19 16 15   Temp:      TempSrc:      SpO2:      Weight:      Height:       SpO2: 98 %   Intake/Output Summary (Last 24 hours) at 10/14/2024 0613 Last data filed at 10/14/2024 0400 Gross per 24 hour  Intake 3 ml  Output 1325 ml  Net -1322 ml   Filed Weights   10/13/24 1805 10/13/24 2143  Weight: 103.6 kg 102.6 kg    Exam: General exam: In no acute distress. Respiratory system: Good air movement and clear to auscultation. Cardiovascular system: S1 & S2 heard, RRR. No JVD. Gastrointestinal system: Abdomen is nondistended, soft and nontender.  Extremities: No pedal edema. Skin: No rashes, lesions or ulcers Psychiatry: Judgement and insight appear normal. Mood & affect appropriate.    Data Reviewed:    Labs: Basic Metabolic Panel: Recent Labs  Lab 10/13/24 1811 10/14/24 0249  NA 139 139  K 4.0 3.6  CL 104 106  CO2 23 25  GLUCOSE 120* 118*  BUN 18 16  CREATININE 1.37* 1.45*  CALCIUM  9.1 8.9  MG  --  1.8   GFR Estimated Creatinine Clearance: 53.7 mL/min (A) (by C-G formula based on SCr of 1.45 mg/dL (H)). Liver Function Tests: Recent Labs  Lab 10/13/24 1811  AST 25  ALT 37  ALKPHOS 72  BILITOT 0.6  PROT 6.4*  ALBUMIN 4.0   Recent Labs  Lab 10/13/24 1811  LIPASE 19   No results for input(s): AMMONIA in the last 168 hours. Coagulation profile No results for input(s): INR, PROTIME in the last 168  hours. COVID-19 Labs  No results for input(s): DDIMER, FERRITIN, LDH, CRP in the last 72 hours.  Lab Results  Component Value Date   SARSCOV2NAA NEGATIVE 09/30/2024   SARSCOV2NAA NEGATIVE 12/08/2023   SARSCOV2NAA NEGATIVE 01/26/2023   SARSCOV2NAA POSITIVE (A) 12/29/2022    CBC: Recent Labs  Lab 10/13/24 1811 10/14/24 0249  WBC 11.8* 10.7*  HGB 13.2 12.5*  HCT 38.6* 36.7*  MCV 87.7 87.8  PLT 186 177   Cardiac Enzymes: No results for input(s): CKTOTAL, CKMB, CKMBINDEX, TROPONINI in the last 168 hours. BNP (last 3 results) Recent Labs    10/13/24 1811  PROBNP 3,179.0*   CBG: No results for input(s): GLUCAP in the last 168 hours. D-Dimer: No results for input(s): DDIMER in the last 72 hours. Hgb A1c: No results for input(s): HGBA1C in the last 72 hours. Lipid Profile: No results for input(s): CHOL, HDL, LDLCALC, TRIG, CHOLHDL, LDLDIRECT in the last 72 hours. Thyroid function studies: No results for input(s): TSH, T4TOTAL, T3FREE, THYROIDAB in the last 72 hours.  Invalid input(s): FREET3 Anemia work up: No results for input(s): VITAMINB12, FOLATE, FERRITIN, TIBC, IRON, RETICCTPCT in the last 72 hours. Sepsis Labs: Recent Labs  Lab 10/13/24 1811 10/13/24 2142 10/14/24 0249  WBC 11.8*  --  10.7*  LATICACIDVEN 1.7 1.7  --    Microbiology No results found for this or any previous visit (from the past 240 hours).   Medications:    amLODipine  2.5 mg Oral Daily   apixaban  5 mg Oral BID   pantoprazole  40 mg Oral Daily   potassium chloride   20 mEq Oral Once   sodium chloride  flush  3 mL Intravenous Q12H   tiZANidine   4 mg Oral QHS   zolpidem   5 mg Oral QHS   Continuous Infusions:  magnesium  sulfate bolus IVPB        LOS: 1 day   Brandon Burns  [facility]  10/14/2024, 6:13 AM

## 2024-10-14 NOTE — Plan of Care (Signed)

## 2024-10-14 NOTE — Progress Notes (Signed)
 Rounding Note   Patient Name: Brandon Burns Date of Encounter: 10/14/2024  [facility] Cardiologist: Soyla DELENA Merck, MD   Subjective Feeling short of breath and tired.  No lightheadedness.   Scheduled Meds:  amLODipine  2.5 mg Oral Daily   apixaban  5 mg Oral BID   pantoprazole  40 mg Oral Daily   sodium chloride  flush  3 mL Intravenous Q12H   tiZANidine   4 mg Oral QHS   zolpidem   5 mg Oral QHS   Continuous Infusions:  sodium chloride  75 mL/hr at 10/14/24 0819   magnesium  sulfate bolus IVPB     PRN Meds: acetaminophen  **OR** acetaminophen , ondansetron  **OR** ondansetron  (ZOFRAN ) IV, senna-docusate   Vital Signs  Vitals:   10/14/24 0615 10/14/24 0630 10/14/24 0645 10/14/24 0737  BP: 113/66   (!) 101/53  Pulse:      Resp: (!) 21 15 14  (!) 21  Temp:    98.6 F (37 C)  TempSrc:    Oral  SpO2:    99%  Weight:      Height:        Intake/Output Summary (Last 24 hours) at 10/14/2024 0932 Last data filed at 10/14/2024 0400 Gross per 24 hour  Intake 3 ml  Output 1325 ml  Net -1322 ml      10/13/2024    9:43 PM 10/13/2024    6:05 PM 10/04/2024   10:58 AM  Last 3 Weights  Weight (lbs) 226 lb 3.2 oz 228 lb 6.4 oz 228 lb 6.4 oz  Weight (kg) 102.604 kg 103.602 kg 103.602 kg      Telemetry Atrial fibrillation. Bradycardic.  Rate 30s-50s  - Personally Reviewed  ECG  N/a - Personally Reviewed  Physical Exam  VS:  BP (!) 101/53 (BP Location: Right Arm)   Pulse 62   Temp 98.6 F (37 C) (Oral)   Resp (!) 21   Ht 6' 1 (1.854 m)   Wt 102.6 kg   SpO2 99%   BMI 29.84 kg/m  , BMI Body mass index is 29.84 kg/m. GENERAL:  Well appearing HEENT: Pupils equal round and reactive, fundi not visualized, oral mucosa unremarkable NECK:  No jugular venous distention, waveform within normal limits, carotid upstroke brisk and symmetric, no bruits, no thyromegaly LUNGS:  Clear to auscultation bilaterally HEART:  Irregularly irregular.  Bradycardic.  PMI not  displaced or sustained,S1 and S2 within normal limits, no S3, no S4, no clicks, no rubs, no murmurs ABD:  Flat, positive bowel sounds normal in frequency in pitch, no bruits, no rebound, no guarding, no midline pulsatile mass, no hepatomegaly, no splenomegaly EXT:  2 plus pulses throughout, no edema, no cyanosis no clubbing SKIN:  No rashes no nodules NEURO:  Cranial nerves II through XII grossly intact, motor grossly intact throughout PSYCH:  Cognitively intact, oriented to person place and time   Labs High Sensitivity Troponin:  No results for input(s): TROPONINIHS in the last 720 hours.   Chemistry Recent Labs  Lab 10/13/24 1811 10/14/24 0249  NA 139 139  K 4.0 3.6  CL 104 106  CO2 23 25  GLUCOSE 120* 118*  BUN 18 16  CREATININE 1.37* 1.45*  CALCIUM  9.1 8.9  MG  --  1.8  PROT 6.4*  --   ALBUMIN 4.0  --   AST 25  --   ALT 37  --   ALKPHOS 72  --   BILITOT 0.6  --   GFRNONAA 53* 50*  ANIONGAP 12  8    Lipids No results for input(s): CHOL, TRIG, HDL, LABVLDL, LDLCALC, CHOLHDL in the last 168 hours.  Hematology Recent Labs  Lab 10/13/24 1811 10/14/24 0249  WBC 11.8* 10.7*  RBC 4.40 4.18*  HGB 13.2 12.5*  HCT 38.6* 36.7*  MCV 87.7 87.8  MCH 30.0 29.9  MCHC 34.2 34.1  RDW 14.2 14.1  PLT 186 177   Thyroid No results for input(s): TSH, FREET4 in the last 168 hours.  BNP Recent Labs  Lab 10/13/24 1811  PROBNP 3,179.0*    DDimer No results for input(s): DDIMER in the last 168 hours.   Radiology  DG Chest Port 1 View Result Date: 10/13/2024 EXAM: 1 VIEW(S) XRAY OF THE CHEST 10/13/2024 06:31:00 PM COMPARISON: 09/30/2024 CLINICAL HISTORY: sob FINDINGS: LUNGS AND PLEURA: No focal pulmonary opacity. No pulmonary edema. No pleural effusion. No pneumothorax. HEART AND MEDIASTINUM: No acute abnormality of the cardiac and mediastinal silhouettes. BONES AND SOFT TISSUES: No acute osseous abnormality. IMPRESSION: 1. No acute cardiopulmonary process.  Electronically signed by: Pinkie Pebbles MD 10/13/2024 07:00 PM EST RP Workstation: [id number]    Cardiac Studies  LHC 10/2023:   Mid RCA lesion is 20% stenosed.   Mid LAD lesion is 20% stenosed.   Mild non-obstructive CAD    Patient Profile   78 y.o. male with non-obstructive CAD, HTN, HL, PAF, and OSA admitted with shortness of breath.    Assessment & Plan   # Persistent AF:  Diagnosed 09/30/24 in the setting of bronchitis.  Bronchitis has resolved but he has persistent exertional dyspnea.  He never started the prescribed Eliquis.  Discussed the importance of taking this daily as prescribed.  He is interested in a Watchman device long term.   Informed Consent   Shared Decision Making/Informed Consent   The risks [stroke, cardiac arrhythmias rarely resulting in the need for a temporary or permanent pacemaker, skin irritation or burns, esophageal damage, perforation (1:10,000 risk), bleeding, pharyngeal hematoma as well as other potential complications associated with conscious sedation including aspiration, arrhythmia, respiratory failure and death], benefits (treatment guidance, restoration of normal sinus rhythm, diagnostic support) and alternatives of a transesophageal echocardiogram guided cardioversion were discussed in detail with Mr. Douville and he is willing to proceed.    # Bradycardia:  Rate ~60s when in sinus rhyhtm.  Will evaluate after DCCV.  Avoid nodal agents.   # OSA: Planning to go for [facility] consult later this month.  He understands he must be on uninterrupted anticoagulation x1 month post DCCV.   # Acute heart failure, type unknown:  Pro-BNP 3179.  Echo pending. No pulmonary edema on CXR.  He notes persistent SOB.  Not clinically volume overloaded.  Hold IV fluids.    # Hypertension:  BP stable on amlodipine.  # Hyperlipidemai:  Continue Zetia . Minimal non-obstructive CAD on prior cath.     For questions or updates, please contact [facility] Please consult [email or web address] for contact info under       Signed, Annabella Scarce, MD  10/14/2024, 9:32 AM

## 2024-10-14 NOTE — H&P (View-Only) (Signed)
 Rounding Note   Patient Name: Brandon Burns Date of Encounter: 10/14/2024  [facility] Cardiologist: Soyla DELENA Merck, MD   Subjective Feeling short of breath and tired.  No lightheadedness.   Scheduled Meds:  amLODipine  2.5 mg Oral Daily   apixaban  5 mg Oral BID   pantoprazole  40 mg Oral Daily   sodium chloride  flush  3 mL Intravenous Q12H   tiZANidine   4 mg Oral QHS   zolpidem   5 mg Oral QHS   Continuous Infusions:  sodium chloride  75 mL/hr at 10/14/24 0819   magnesium  sulfate bolus IVPB     PRN Meds: acetaminophen  **OR** acetaminophen , ondansetron  **OR** ondansetron  (ZOFRAN ) IV, senna-docusate   Vital Signs  Vitals:   10/14/24 0615 10/14/24 0630 10/14/24 0645 10/14/24 0737  BP: 113/66   (!) 101/53  Pulse:      Resp: (!) 21 15 14  (!) 21  Temp:    98.6 F (37 C)  TempSrc:    Oral  SpO2:    99%  Weight:      Height:        Intake/Output Summary (Last 24 hours) at 10/14/2024 0932 Last data filed at 10/14/2024 0400 Gross per 24 hour  Intake 3 ml  Output 1325 ml  Net -1322 ml      10/13/2024    9:43 PM 10/13/2024    6:05 PM 10/04/2024   10:58 AM  Last 3 Weights  Weight (lbs) 226 lb 3.2 oz 228 lb 6.4 oz 228 lb 6.4 oz  Weight (kg) 102.604 kg 103.602 kg 103.602 kg      Telemetry Atrial fibrillation. Bradycardic.  Rate 30s-50s  - Personally Reviewed  ECG  N/a - Personally Reviewed  Physical Exam  VS:  BP (!) 101/53 (BP Location: Right Arm)   Pulse 62   Temp 98.6 F (37 C) (Oral)   Resp (!) 21   Ht 6' 1 (1.854 m)   Wt 102.6 kg   SpO2 99%   BMI 29.84 kg/m  , BMI Body mass index is 29.84 kg/m. GENERAL:  Well appearing HEENT: Pupils equal round and reactive, fundi not visualized, oral mucosa unremarkable NECK:  No jugular venous distention, waveform within normal limits, carotid upstroke brisk and symmetric, no bruits, no thyromegaly LUNGS:  Clear to auscultation bilaterally HEART:  Irregularly irregular.  Bradycardic.  PMI not  displaced or sustained,S1 and S2 within normal limits, no S3, no S4, no clicks, no rubs, no murmurs ABD:  Flat, positive bowel sounds normal in frequency in pitch, no bruits, no rebound, no guarding, no midline pulsatile mass, no hepatomegaly, no splenomegaly EXT:  2 plus pulses throughout, no edema, no cyanosis no clubbing SKIN:  No rashes no nodules NEURO:  Cranial nerves II through XII grossly intact, motor grossly intact throughout PSYCH:  Cognitively intact, oriented to person place and time   Labs High Sensitivity Troponin:  No results for input(s): TROPONINIHS in the last 720 hours.   Chemistry Recent Labs  Lab 10/13/24 1811 10/14/24 0249  NA 139 139  K 4.0 3.6  CL 104 106  CO2 23 25  GLUCOSE 120* 118*  BUN 18 16  CREATININE 1.37* 1.45*  CALCIUM  9.1 8.9  MG  --  1.8  PROT 6.4*  --   ALBUMIN 4.0  --   AST 25  --   ALT 37  --   ALKPHOS 72  --   BILITOT 0.6  --   GFRNONAA 53* 50*  ANIONGAP 12  8    Lipids No results for input(s): CHOL, TRIG, HDL, LABVLDL, LDLCALC, CHOLHDL in the last 168 hours.  Hematology Recent Labs  Lab 10/13/24 1811 10/14/24 0249  WBC 11.8* 10.7*  RBC 4.40 4.18*  HGB 13.2 12.5*  HCT 38.6* 36.7*  MCV 87.7 87.8  MCH 30.0 29.9  MCHC 34.2 34.1  RDW 14.2 14.1  PLT 186 177   Thyroid No results for input(s): TSH, FREET4 in the last 168 hours.  BNP Recent Labs  Lab 10/13/24 1811  PROBNP 3,179.0*    DDimer No results for input(s): DDIMER in the last 168 hours.   Radiology  DG Chest Port 1 View Result Date: 10/13/2024 EXAM: 1 VIEW(S) XRAY OF THE CHEST 10/13/2024 06:31:00 PM COMPARISON: 09/30/2024 CLINICAL HISTORY: sob FINDINGS: LUNGS AND PLEURA: No focal pulmonary opacity. No pulmonary edema. No pleural effusion. No pneumothorax. HEART AND MEDIASTINUM: No acute abnormality of the cardiac and mediastinal silhouettes. BONES AND SOFT TISSUES: No acute osseous abnormality. IMPRESSION: 1. No acute cardiopulmonary process.  Electronically signed by: Pinkie Pebbles MD 10/13/2024 07:00 PM EST RP Workstation: [id number]    Cardiac Studies  LHC 10/2023:   Mid RCA lesion is 20% stenosed.   Mid LAD lesion is 20% stenosed.   Mild non-obstructive CAD    Patient Profile   78 y.o. male with non-obstructive CAD, HTN, HL, PAF, and OSA admitted with shortness of breath.    Assessment & Plan   # Persistent AF:  Diagnosed 09/30/24 in the setting of bronchitis.  Bronchitis has resolved but he has persistent exertional dyspnea.  He never started the prescribed Eliquis.  Discussed the importance of taking this daily as prescribed.  He is interested in a Watchman device long term.   Informed Consent   Shared Decision Making/Informed Consent   The risks [stroke, cardiac arrhythmias rarely resulting in the need for a temporary or permanent pacemaker, skin irritation or burns, esophageal damage, perforation (1:10,000 risk), bleeding, pharyngeal hematoma as well as other potential complications associated with conscious sedation including aspiration, arrhythmia, respiratory failure and death], benefits (treatment guidance, restoration of normal sinus rhythm, diagnostic support) and alternatives of a transesophageal echocardiogram guided cardioversion were discussed in detail with Mr. Douville and he is willing to proceed.    # Bradycardia:  Rate ~60s when in sinus rhyhtm.  Will evaluate after DCCV.  Avoid nodal agents.   # OSA: Planning to go for [facility] consult later this month.  He understands he must be on uninterrupted anticoagulation x1 month post DCCV.   # Acute heart failure, type unknown:  Pro-BNP 3179.  Echo pending. No pulmonary edema on CXR.  He notes persistent SOB.  Not clinically volume overloaded.  Hold IV fluids.    # Hypertension:  BP stable on amlodipine.  # Hyperlipidemai:  Continue Zetia . Minimal non-obstructive CAD on prior cath.     For questions or updates, please contact [facility] Please consult [email or web address] for contact info under       Signed, Annabella Scarce, MD  10/14/2024, 9:32 AM

## 2024-10-15 ENCOUNTER — Encounter (HOSPITAL_COMMUNITY): Admission: EM | Disposition: A | Payer: Self-pay | Source: Home / Self Care | Attending: Internal Medicine

## 2024-10-15 ENCOUNTER — Inpatient Hospital Stay (HOSPITAL_COMMUNITY)

## 2024-10-15 DIAGNOSIS — I441 Atrioventricular block, second degree: Secondary | ICD-10-CM | POA: Diagnosis not present

## 2024-10-15 DIAGNOSIS — I129 Hypertensive chronic kidney disease with stage 1 through stage 4 chronic kidney disease, or unspecified chronic kidney disease: Secondary | ICD-10-CM | POA: Diagnosis not present

## 2024-10-15 DIAGNOSIS — I509 Heart failure, unspecified: Secondary | ICD-10-CM | POA: Diagnosis not present

## 2024-10-15 DIAGNOSIS — N1831 Chronic kidney disease, stage 3a: Secondary | ICD-10-CM

## 2024-10-15 DIAGNOSIS — I4891 Unspecified atrial fibrillation: Secondary | ICD-10-CM

## 2024-10-15 DIAGNOSIS — I2510 Atherosclerotic heart disease of native coronary artery without angina pectoris: Secondary | ICD-10-CM

## 2024-10-15 DIAGNOSIS — I340 Nonrheumatic mitral (valve) insufficiency: Secondary | ICD-10-CM

## 2024-10-15 HISTORY — PX: TRANSESOPHAGEAL ECHOCARDIOGRAM (CATH LAB): EP1270

## 2024-10-15 HISTORY — PX: CARDIOVERSION: EP1203

## 2024-10-15 LAB — BASIC METABOLIC PANEL WITH GFR
Anion gap: 7 (ref 5–15)
BUN: 17 mg/dL (ref 8–23)
CO2: 26 mmol/L (ref 22–32)
Calcium: 8.6 mg/dL — ABNORMAL LOW (ref 8.9–10.3)
Chloride: 104 mmol/L (ref 98–111)
Creatinine, Ser: 1.46 mg/dL — ABNORMAL HIGH (ref 0.61–1.24)
GFR, Estimated: 49 mL/min — ABNORMAL LOW (ref >60)
Glucose, Bld: 106 mg/dL — ABNORMAL HIGH (ref 70–99)
Potassium: 3.5 mmol/L (ref 3.5–5.1)
Sodium: 137 mmol/L (ref 135–145)

## 2024-10-15 LAB — CBC
HCT: 36.9 % — ABNORMAL LOW (ref 39.0–52.0)
Hemoglobin: 12.5 g/dL — ABNORMAL LOW (ref 13.0–17.0)
MCH: 29.9 pg (ref 26.0–34.0)
MCHC: 33.9 g/dL (ref 30.0–36.0)
MCV: 88.3 fL (ref 80.0–100.0)
Platelets: 186 K/uL (ref 150–400)
RBC: 4.18 MIL/uL — ABNORMAL LOW (ref 4.22–5.81)
RDW: 14.2 % (ref 11.5–15.5)
WBC: 10.9 K/uL — ABNORMAL HIGH (ref 4.0–10.5)
nRBC: 0 % (ref 0.0–0.2)

## 2024-10-15 LAB — MAGNESIUM: Magnesium: 2 mg/dL (ref 1.7–2.4)

## 2024-10-15 LAB — ECHO TEE

## 2024-10-15 SURGERY — TRANSESOPHAGEAL ECHOCARDIOGRAM (TEE) (CATHLAB)
Anesthesia: Monitor Anesthesia Care

## 2024-10-15 MED ORDER — SODIUM CHLORIDE 0.9 % IV SOLN: INTRAVENOUS | Status: DC

## 2024-10-15 MED ORDER — PROPOFOL 500 MG/50ML IV EMUL
INTRAVENOUS | Status: DC | PRN
  Administered 2024-10-15: 30 mg via INTRAVENOUS
  Administered 2024-10-15: 150 ug/kg/min via INTRAVENOUS
  Administered 2024-10-15: 20 mg via INTRAVENOUS

## 2024-10-15 MED ORDER — LIDOCAINE 2% (20 MG/ML) 5 ML SYRINGE
INTRAMUSCULAR | Status: DC | PRN
  Administered 2024-10-15: 50 mg via INTRAVENOUS

## 2024-10-15 MED ORDER — BUTAMBEN-TETRACAINE-BENZOCAINE 2-2-14 % EX AERO
INHALATION_SPRAY | CUTANEOUS | Status: DC | PRN
  Administered 2024-10-15: 1 via TOPICAL

## 2024-10-15 SURGICAL SUPPLY — 1 items: PAD DEFIB RADIO PHYSIO CONN (PAD) ×1 IMPLANT

## 2024-10-15 NOTE — Progress Notes (Signed)
 Mobility Specialist Progress Note:    10/15/24 1300  Mobility  Activity Ambulated with assistance  Level of Assistance Standby assist, set-up cues, supervision of patient - no hands on  Assistive Device None  Distance Ambulated (ft) 500 ft  Range of Motion/Exercises Active  Activity Response Tolerated well  Mobility Referral Yes  Mobility visit 1 Mobility  Mobility Specialist Start Time (ACUTE ONLY) 1259  Mobility Specialist Stop Time (ACUTE ONLY) 1319  Mobility Specialist Time Calculation (min) (ACUTE ONLY) 20 min   Received pt laying in bed agreeable to session. No C/o any symptoms. Pt moving and ambulating well. Pt HR initially stayed b/t 48 to mid 50's during first 150 ft. Shortly after, pt stayed between low 60's to a noted high of 81. Noted PVCs throughout ambulation. Returned pt to room w/ all needs met.   Venetia Keel Mobility Specialist Please Neurosurgeon or Rehab Office at [phone]

## 2024-10-15 NOTE — Anesthesia Preprocedure Evaluation (Signed)
 Anesthesia Evaluation  Patient identified by MRN, date of birth, ID band Patient awake    Reviewed: Allergy & Precautions, H&P , NPO status , Patient's Chart, lab work & pertinent test results  Airway Mallampati: II  TM Distance: >3 FB Neck ROM: Full    Dental no notable dental hx.    Pulmonary sleep apnea    Pulmonary exam normal breath sounds clear to auscultation       Cardiovascular hypertension, (-) angina + CAD  (-) Past MI Normal cardiovascular exam+ dysrhythmias Atrial Fibrillation  Rhythm:Regular Rate:Normal  IMPRESSIONS     1. Left ventricular ejection fraction, by estimation, is 60 to 65%. The  left ventricle has normal function. The left ventricle has no regional  wall motion abnormalities. Left ventricular diastolic function could not  be evaluated.   2. Right ventricular systolic function is normal. The right ventricular  size is normal.   3. The mitral valve is normal in structure. No evidence of mitral valve  regurgitation. No evidence of mitral stenosis.   4. The aortic valve is tricuspid. Aortic valve regurgitation is not  visualized. Aortic valve sclerosis is present, with no evidence of aortic  valve stenosis. Aortic valve area, by VTI measures 2.25 cm. Aortic valve  mean gradient measures 4.0 mmHg.  Aortic valve Vmax measures 1.44 m/s.   5. The inferior vena cava is normal in size with greater than 50%  respiratory variability, suggesting right atrial pressure of 3 mmHg.   6. Patient appears to be in atrial fibrillation.      Neuro/Psych neg Seizures PSYCHIATRIC DISORDERS Anxiety     negative neurological ROS     GI/Hepatic Neg liver ROS,GERD  ,,  Endo/Other  negative endocrine ROS    Renal/GU CRFRenal disease  negative genitourinary   Musculoskeletal negative musculoskeletal ROS (+)    Abdominal   Peds negative pediatric ROS (+)  Hematology negative hematology ROS (+)   Anesthesia  Other Findings   Reproductive/Obstetrics negative OB ROS                              Anesthesia Physical Anesthesia Plan  ASA: 3  Anesthesia Plan: MAC   Post-op Pain Management:    Induction: Intravenous  PONV Risk Score and Plan: 1 and Propofol infusion and Treatment may vary due to age or medical condition  Airway Management Planned: Natural Airway  Additional Equipment:   Intra-op Plan:   Post-operative Plan:   Informed Consent: I have reviewed the patients History and Physical, chart, labs and discussed the procedure including the risks, benefits and alternatives for the proposed anesthesia with the patient or authorized representative who has indicated his/her understanding and acceptance.     Dental advisory given  Plan Discussed with: CRNA  Anesthesia Plan Comments:          Anesthesia Quick Evaluation

## 2024-10-15 NOTE — CV Procedure (Signed)
   TRANSESOPHAGEAL ECHOCARDIOGRAM GUIDED DIRECT CURRENT CARDIOVERSION  NAME:  Brandon Burns    MRN: [id number] DOB:  1946-08-30    ADMIT DATE: 10/13/2024  INDICATIONS: Symptomatic atrial fibrillation   PROCEDURE:   Informed consent was obtained prior to the procedure. The risks, benefits and alternatives for the procedure were discussed and the patient comprehended these risks.  Risks include, but are not limited to, cough, sore throat, vomiting, nausea, somnolence, esophageal and stomach trauma or perforation, bleeding, low blood pressure, aspiration, pneumonia, infection, trauma to the teeth and death.    After a procedural time-out, the oropharynx was anesthetized and the patient was sedated by the anesthesia service. The transesophageal probe was inserted in the esophagus and stomach without difficulty and multiple views were obtained. Anesthesia was monitored by the anesthesiology team.  COMPLICATIONS:    Complications: No complications Patient tolerated procedure well.  KEY FINDINGS:  No LAA thrombus  Full Report to follow.   CARDIOVERSION:     Indications:  Symptomatic Atrial Fibrillation  Procedure Details:  Once the TEE was complete, the patient had the defibrillator pads placed in the anterior and posterior position. Once an appropriate level of sedation was confirmed, the patient was cardioverted  x1  with 360J of biphasic synchronized energy.  The patient converted to sinus bradycardia.  There were no apparent complications.  The patient had normal neuro status and respiratory status post procedure with vitals stable as recorded elsewhere.  Adequate airway was maintained throughout and vital signs monitored per protocol.  The was advised to continue anticoagulation for minimum 4 week with interruption.   All questions has been answered at this time.  Valaree Fresquez T. Floretta HEATH, MD [place]  [facility]  9:32 AM

## 2024-10-15 NOTE — Progress Notes (Signed)
  Echocardiogram Echocardiogram Transesophageal has been performed.  Koleen KANDICE Popper, RDCS 10/15/2024, 9:36 AM

## 2024-10-15 NOTE — Progress Notes (Signed)
 [facility] PROGRESS NOTE    Progress Note  Brandon Burns  FMW:[id number] DOB: November 28, 1946 DOA: 10/13/2024 PCP: Millicent Sharper, MD     Brief Narrative:   Brandon Burns is an 78 y.o. male past medical history significant for essential hypertension, struct of sleep apnea, chronic kidney disease stage III AA recently diagnosed with atrial fibrillation on 09/30/2024 comes in with fatigue and exertional dyspnea, recently seen treated for acute bronchitis with steroids and antibiotics with no improvement   Assessment/Plan:   Atrial fibrillation with slow ventricular response (HCC) Now in sinus bradycardia. Cardiology consulted S/p  TEE and DCCV. Cards consulting EP. 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 Continue Eliquis. Heart healthy diet. Currently on no AV nodal blocking agents  Essential hypertension Continue Norvasc.  CKD stage 3a, GFR 45-59 ml/min (HCC) At baseline.  CAD (coronary artery disease) No anginal symptoms.   DVT prophylaxis: Eliquis Family Communication:wife Status is: Inpatient Remains inpatient appropriate because: A-fib with RVR    Code Status:     Code Status Orders  (From admission, onward)           Start     Ordered   10/13/24 2235  Full code  Continuous       Question:  By:  Answer:  Consent: discussion documented in EHR   10/13/24 2235           Code Status History     Date Active Date Inactive Code Status Order ID Comments User Context   10/19/2023 0831 10/19/2023 1602 Full Code [id number]  Verlin Lonni BIRCH, MD Inpatient   01/26/2023 1810 01/28/2023 1845 Full Code [id number]  Alfornia Madison, MD Inpatient         IV Access:   Peripheral IV   Procedures and diagnostic studies:   ECHOCARDIOGRAM COMPLETE Result Date: 10/14/2024    ECHOCARDIOGRAM REPORT   Patient Name:   Brandon Burns Date of Exam: 10/14/2024 Medical Rec #:  [id number]      Height:       73.0 in  Accession #:    [id number]     Weight:       226.2 lb Date of Birth:  [birth year]      BSA:          2.267 m Patient Age:    78 years       BP:           113/66 mmHg Patient Gender: M              HR:           52 bpm. Exam Location:  Inpatient Procedure: 2D Echo and Intracardiac Opacification Agent (Both Spectral and Color            Flow Doppler were utilized during procedure). Indications:    Dyspnea  History:        Patient has prior history of Echocardiogram examinations.                 Signs/Symptoms:Dyspnea.  Sonographer:    Norleen Amour Referring Phys: HUSAM M SALAH IMPRESSIONS  1. Left ventricular ejection fraction, by estimation, is 60 to 65%. The left ventricle has normal function. The left ventricle has no regional wall motion abnormalities. Left ventricular diastolic function could not be evaluated.  2. Right ventricular systolic function is normal. The right ventricular size is normal.  3. The mitral valve is normal in structure. No evidence  of mitral valve regurgitation. No evidence of mitral stenosis.  4. The aortic valve is tricuspid. Aortic valve regurgitation is not visualized. Aortic valve sclerosis is present, with no evidence of aortic valve stenosis. Aortic valve area, by VTI measures 2.25 cm. Aortic valve mean gradient measures 4.0 mmHg. Aortic valve Vmax measures 1.44 m/s.  5. The inferior vena cava is normal in size with greater than 50% respiratory variability, suggesting right atrial pressure of 3 mmHg.  6. Patient appears to be in atrial fibrillation. FINDINGS  Left Ventricle: Left ventricular ejection fraction, by estimation, is 60 to 65%. The left ventricle has normal function. The left ventricle has no regional wall motion abnormalities. The left ventricular internal cavity size was normal in size. There is  no left ventricular hypertrophy. Left ventricular diastolic function could not be evaluated due to atrial fibrillation. Left ventricular diastolic function could not be  evaluated. Right Ventricle: The right ventricular size is normal. No increase in right ventricular wall thickness. Right ventricular systolic function is normal. Left Atrium: Left atrial size was normal in size. Right Atrium: Right atrial size was normal in size. Pericardium: There is no evidence of pericardial effusion. Mitral Valve: The mitral valve is normal in structure. No evidence of mitral valve regurgitation. No evidence of mitral valve stenosis. MV peak gradient, 7.6 mmHg. The mean mitral valve gradient is 2.0 mmHg. Tricuspid Valve: The tricuspid valve is normal in structure. Tricuspid valve regurgitation is trivial. No evidence of tricuspid stenosis. Aortic Valve: The aortic valve is tricuspid. Aortic valve regurgitation is not visualized. Aortic valve sclerosis is present, with no evidence of aortic valve stenosis. Aortic valve mean gradient measures 4.0 mmHg. Aortic valve peak gradient measures 8.3  mmHg. Aortic valve area, by VTI measures 2.25 cm. Pulmonic Valve: The pulmonic valve was normal in structure. Pulmonic valve regurgitation is trivial. No evidence of pulmonic stenosis. Aorta: The aortic root is normal in size and structure. Venous: The inferior vena cava is normal in size with greater than 50% respiratory variability, suggesting right atrial pressure of 3 mmHg. IAS/Shunts: No atrial level shunt detected by color flow Doppler.  LEFT VENTRICLE PLAX 2D LVIDd:         4.60 cm      Diastology LVIDs:         2.60 cm      LV e' medial:    11.00 cm/s LV PW:         1.00 cm      LV E/e' medial:  13.1 LV IVS:        0.90 cm      LV e' lateral:   10.20 cm/s LVOT diam:     1.90 cm      LV E/e' lateral: 14.1 LV SV:         69 LV SV Index:   31 LVOT Area:     2.84 cm LV IVRT:       88 msec  LV Volumes (MOD) LV vol d, MOD A2C: 137.0 ml LV vol d, MOD A4C: 142.0 ml LV vol s, MOD A2C: 29.4 ml LV vol s, MOD A4C: 35.4 ml LV SV MOD A2C:     107.6 ml LV SV MOD A4C:     142.0 ml LV SV MOD BP:      106.0 ml RIGHT  VENTRICLE             IVC RV Basal diam:  3.70 cm     IVC diam: 1.70 cm RV  S prime:     13.80 cm/s TAPSE (M-mode): 2.1 cm      PULMONARY VEINS                             Diastolic Velocity: 74.30 cm/s                             S/D Velocity:       0.50                             Systolic Velocity:  36.90 cm/s LEFT ATRIUM           Index        RIGHT ATRIUM           Index LA diam:      3.20 cm 1.41 cm/m   RA Area:     14.30 cm LA Vol (A2C): 39.8 ml 17.55 ml/m  RA Volume:   35.90 ml  15.83 ml/m LA Vol (A4C): 60.5 ml 26.68 ml/m  AORTIC VALVE                    PULMONIC VALVE AV Area (Vmax):    2.01 cm     PV Vmax:       0.99 m/s AV Area (Vmean):   2.22 cm     PV Peak grad:  3.9 mmHg AV Area (VTI):     2.25 cm AV Vmax:           144.00 cm/s AV Vmean:          90.200 cm/s AV VTI:            0.309 m AV Peak Grad:      8.3 mmHg AV Mean Grad:      4.0 mmHg LVOT Vmax:         102.00 cm/s LVOT Vmean:        70.700 cm/s LVOT VTI:          0.245 m LVOT/AV VTI ratio: 0.79  AORTA Ao Root diam: 3.00 cm Ao Asc diam:  3.20 cm MITRAL VALVE MV Area (PHT): 3.42 cm     SHUNTS MV Area VTI:   2.06 cm     Systemic VTI:  0.24 m MV Peak grad:  7.6 mmHg     Systemic Diam: 1.90 cm MV Mean grad:  2.0 mmHg MV Vmax:       1.38 m/s MV Vmean:      55.5 cm/s MV Decel Time: 222 msec MV E velocity: 144.00 cm/s MV A velocity: 27.20 cm/s MV E/A ratio:  5.29 Brandon Bihari MD Electronically signed by Brandon Bihari MD Signature Date/Time: 10/14/2024/5:07:11 PM    Final    DG Chest Port 1 View Result Date: 10/13/2024 EXAM: 1 VIEW(S) XRAY OF THE CHEST 10/13/2024 06:31:00 PM COMPARISON: 09/30/2024 CLINICAL HISTORY: sob FINDINGS: LUNGS AND PLEURA: No focal pulmonary opacity. No pulmonary edema. No pleural effusion. No pneumothorax. HEART AND MEDIASTINUM: No acute abnormality of the cardiac and mediastinal silhouettes. BONES AND SOFT TISSUES: No acute osseous abnormality. IMPRESSION: 1. No acute cardiopulmonary process. Electronically signed by:  Pinkie Pebbles MD 10/13/2024 07:00 PM EST RP Workstation: [id number]     Medical Consultants:   None.   Subjective:    Brandon Burns no complaints  Objective:    Vitals:  10/14/24 2348 10/14/24 2349 10/15/24 0350 10/15/24 0741  BP: (!) 156/68  (!) 109/55 138/72  Pulse: (!) 50 (!) 50 (!) 40 (!) 47  Resp: (!) 22  16 19   Temp: 98.5 F (36.9 C)  98 F (36.7 C) 98.5 F (36.9 C)  TempSrc: Oral  Temporal Temporal  SpO2: 98% 96% 96% 97%  Weight:      Height:       SpO2: 97 %   Intake/Output Summary (Last 24 hours) at 10/15/2024 0755 Last data filed at 10/14/2024 2000 Gross per 24 hour  Intake 680 ml  Output 550 ml  Net 130 ml   Filed Weights   10/13/24 1805 10/13/24 2143  Weight: 103.6 kg 102.6 kg    Exam: General exam: In no acute distress. Respiratory system: Good air movement and clear to auscultation. Cardiovascular system: S1 & S2 heard, RRR. No JVD. Gastrointestinal system: Abdomen is nondistended, soft and nontender.  Extremities: No pedal edema. Skin: No rashes, lesions or ulcers Psychiatry: Judgement and insight appear normal. Mood & affect appropriate.    Data Reviewed:    Labs: Basic Metabolic Panel: Recent Labs  Lab 10/13/24 1811 10/14/24 0249 10/15/24 0244  NA 139 139 137  K 4.0 3.6 3.5  CL 104 106 104  CO2 23 25 26   GLUCOSE 120* 118* 106*  BUN 18 16 17   CREATININE 1.37* 1.45* 1.46*  CALCIUM  9.1 8.9 8.6*  MG  --  1.8 2.0   GFR Estimated Creatinine Clearance: 53.3 mL/min (A) (by C-G formula based on SCr of 1.46 mg/dL (H)). Liver Function Tests: Recent Labs  Lab 10/13/24 1811  AST 25  ALT 37  ALKPHOS 72  BILITOT 0.6  PROT 6.4*  ALBUMIN 4.0   Recent Labs  Lab 10/13/24 1811  LIPASE 19   No results for input(s): AMMONIA in the last 168 hours. Coagulation profile No results for input(s): INR, PROTIME in the last 168 hours. COVID-19 Labs  No results for input(s): DDIMER, FERRITIN, LDH, CRP in the last  72 hours.  Lab Results  Component Value Date   SARSCOV2NAA NEGATIVE 09/30/2024   SARSCOV2NAA NEGATIVE 12/08/2023   SARSCOV2NAA NEGATIVE 01/26/2023   SARSCOV2NAA POSITIVE (A) 12/29/2022    CBC: Recent Labs  Lab 10/13/24 1811 10/14/24 0249 10/15/24 0244  WBC 11.8* 10.7* 10.9*  HGB 13.2 12.5* 12.5*  HCT 38.6* 36.7* 36.9*  MCV 87.7 87.8 88.3  PLT 186 177 186   Cardiac Enzymes: No results for input(s): CKTOTAL, CKMB, CKMBINDEX, TROPONINI in the last 168 hours. BNP (last 3 results) Recent Labs    10/13/24 1811  PROBNP 3,179.0*   CBG: No results for input(s): GLUCAP in the last 168 hours. D-Dimer: No results for input(s): DDIMER in the last 72 hours. Hgb A1c: No results for input(s): HGBA1C in the last 72 hours. Lipid Profile: No results for input(s): CHOL, HDL, LDLCALC, TRIG, CHOLHDL, LDLDIRECT in the last 72 hours. Thyroid function studies: No results for input(s): TSH, T4TOTAL, T3FREE, THYROIDAB in the last 72 hours.  Invalid input(s): FREET3 Anemia work up: No results for input(s): VITAMINB12, FOLATE, FERRITIN, TIBC, IRON, RETICCTPCT in the last 72 hours. Sepsis Labs: Recent Labs  Lab 10/13/24 1811 10/13/24 2142 10/14/24 0249 10/15/24 0244  WBC 11.8*  --  10.7* 10.9*  LATICACIDVEN 1.7 1.7  --   --    Microbiology Recent Results (from the past 240 hours)  Blood culture (routine x 2)     Status: None (Preliminary result)   Collection Time: 10/13/24  6:37 PM   Specimen: BLOOD RIGHT ARM  Result Value Ref Range Status   Specimen Description   Final    BLOOD RIGHT ARM Performed at [facility], [address] [place], [place] [address]    Special Requests   Final    BOTTLES DRAWN AEROBIC AND ANAEROBIC Blood Culture adequate volume Performed at [facility], [address] [place], [place] [address]    Culture   Final    NO GROWTH < 12 HOURS Performed at [facility], [address] [place], [place] [address]    Report Status PENDING  Incomplete  Blood culture (routine x 2)     Status: None (Preliminary result)   Collection Time: 10/13/24  8:02 PM   Specimen: BLOOD  Result Value Ref Range Status   Specimen Description   Final    BLOOD LEFT ANTECUBITAL Performed at [facility], [address] [place], [place] [address]    Special Requests   Final    BOTTLES DRAWN AEROBIC AND ANAEROBIC Blood Culture adequate volume Performed at [facility], [address] [place], [place] [address]    Culture   Final    NO GROWTH < 12 HOURS Performed at [facility], [address], [place], [place] [address]    Report Status PENDING  Incomplete     Medications:    amLODipine  2.5 mg Oral Daily   apixaban  5 mg Oral BID   pantoprazole  40 mg Oral Daily   sodium chloride  flush  3 mL Intravenous Q12H   tiZANidine   4 mg Oral QHS   zolpidem   5 mg Oral QHS   Continuous Infusions:  sodium chloride  20 mL/hr at 10/15/24 0543      LOS: 2 days   Erle Odell Castor  [facility]  10/15/2024, 7:55 AM

## 2024-10-15 NOTE — Transfer of Care (Signed)
 Immediate Anesthesia Transfer of Care Note  Patient: Brandon Burns [facility]  Procedure(s) Performed: TRANSESOPHAGEAL ECHOCARDIOGRAM CARDIOVERSION  Patient Location: Cath Lab  Anesthesia Type:MAC  Level of Consciousness: drowsy  Airway & Oxygen Therapy: Patient Spontanous Breathing  Post-op Assessment: Report given to RN and Post -op Vital signs reviewed and stable  Post vital signs: Reviewed and stable  Last Vitals:  Vitals Value Taken Time  BP 104/50 10/15/24 09:45  Temp 36.8 C 10/15/24 09:38  Pulse 39 10/15/24 09:47  Resp 17 10/15/24 09:47  SpO2 99 % 10/15/24 09:47  Vitals shown include unfiled device data.  Last Pain:  Vitals:   10/15/24 0938  TempSrc: Tympanic  PainSc: Asleep         Complications: No notable events documented.

## 2024-10-15 NOTE — Progress Notes (Signed)
 Rounding Note   Patient Name: Brandon Burns Date of Encounter: 10/15/2024  [facility] Cardiologist: Soyla DELENA Merck, MD   Subjective Less short of breath when sitting up and lying down post DCCV.  No lightheadedness sitting.  Hasn't ambulated.   Scheduled Meds:  amLODipine  2.5 mg Oral Daily   apixaban  5 mg Oral BID   pantoprazole  40 mg Oral Daily   sodium chloride  flush  3 mL Intravenous Q12H   tiZANidine   4 mg Oral QHS   zolpidem   5 mg Oral QHS   Continuous Infusions:   PRN Meds: acetaminophen  **OR** acetaminophen , ondansetron  **OR** ondansetron  (ZOFRAN ) IV, senna-docusate   Vital Signs  Vitals:   10/15/24 0958 10/15/24 1021 10/15/24 1022 10/15/24 1122  BP: 113/61  124/60 (!) 131/49  Pulse: (!) 55     Resp: 18  18 18   Temp:   97.8 F (36.6 C) 97.7 F (36.5 C)  TempSrc:   Oral Oral  SpO2: 95% 95% 99%   Weight:      Height:        Intake/Output Summary (Last 24 hours) at 10/15/2024 1144 Last data filed at 10/14/2024 2000 Gross per 24 hour  Intake 680 ml  Output 550 ml  Net 130 ml      10/13/2024    9:43 PM 10/13/2024    6:05 PM 10/04/2024   10:58 AM  Last 3 Weights  Weight (lbs) 226 lb 3.2 oz 228 lb 6.4 oz 228 lb 6.4 oz  Weight (kg) 102.604 kg 103.602 kg 103.602 kg      Telemetry Atrial fibrillation. Bradycardic.  Rate 30s-50s    Transition to 2:1 second degree AV block.  Ventricualr rate 30s-40s.  - Personally Reviewed  ECG  N/a - Personally Reviewed  Physical Exam  VS:  BP (!) 131/49 (BP Location: Right Arm)   Pulse (!) 55   Temp 97.7 F (36.5 C) (Oral)   Resp 18   Ht 6' 1 (1.854 m)   Wt 102.6 kg   SpO2 99%   BMI 29.84 kg/m  , BMI Body mass index is 29.84 kg/m. GENERAL:  Well appearing HEENT: Pupils equal round and reactive, fundi not visualized, oral mucosa unremarkable NECK:  No jugular venous distention, waveform within normal limits, carotid upstroke brisk and symmetric, no bruits, no thyromegaly LUNGS:  Clear to  auscultation bilaterally HEART:  Regular rate and rhythm.  Bradycardic.  PMI not displaced or sustained,S1 and S2 within normal limits, no S3, no S4, no clicks, no rubs, no murmurs ABD:  Flat, positive bowel sounds normal in frequency in pitch, no bruits, no rebound, no guarding, no midline pulsatile mass, no hepatomegaly, no splenomegaly EXT:  2 plus pulses throughout, no edema, no cyanosis no clubbing SKIN:  No rashes no nodules NEURO:  Cranial nerves II through XII grossly intact, motor grossly intact throughout PSYCH:  Cognitively intact, oriented to person place and time  Labs High Sensitivity Troponin:  No results for input(s): TROPONINIHS in the last 720 hours.   Chemistry Recent Labs  Lab 10/13/24 1811 10/14/24 0249 10/15/24 0244  NA 139 139 137  K 4.0 3.6 3.5  CL 104 106 104  CO2 23 25 26   GLUCOSE 120* 118* 106*  BUN 18 16 17   CREATININE 1.37* 1.45* 1.46*  CALCIUM  9.1 8.9 8.6*  MG  --  1.8 2.0  PROT 6.4*  --   --   ALBUMIN 4.0  --   --   AST 25  --   --  ALT 37  --   --   ALKPHOS 72  --   --   BILITOT 0.6  --   --   GFRNONAA 53* 50* 49*  ANIONGAP 12 8 7     Lipids No results for input(s): CHOL, TRIG, HDL, LABVLDL, LDLCALC, CHOLHDL in the last 168 hours.  Hematology Recent Labs  Lab 10/13/24 1811 10/14/24 0249 10/15/24 0244  WBC 11.8* 10.7* 10.9*  RBC 4.40 4.18* 4.18*  HGB 13.2 12.5* 12.5*  HCT 38.6* 36.7* 36.9*  MCV 87.7 87.8 88.3  MCH 30.0 29.9 29.9  MCHC 34.2 34.1 33.9  RDW 14.2 14.1 14.2  PLT 186 177 186   Thyroid No results for input(s): TSH, FREET4 in the last 168 hours.  BNP Recent Labs  Lab 10/13/24 1811  PROBNP 3,179.0*    DDimer No results for input(s): DDIMER in the last 168 hours.   Radiology  EP STUDY Result Date: 10/15/2024 See surgical note for result.  ECHOCARDIOGRAM COMPLETE Result Date: 10/14/2024    ECHOCARDIOGRAM REPORT   Patient Name:   Brandon Burns Date of Exam: 10/14/2024 Medical Rec #:  [id number]       Height:       73.0 in Accession #:    [id number]     Weight:       226.2 lb Date of Birth:  [birth year]      BSA:          2.267 m Patient Age:    78 years       BP:           113/66 mmHg Patient Gender: M              HR:           52 bpm. Exam Location:  Inpatient Procedure: 2D Echo and Intracardiac Opacification Agent (Both Spectral and Color            Flow Doppler were utilized during procedure). Indications:    Dyspnea  History:        Patient has prior history of Echocardiogram examinations.                 Signs/Symptoms:Dyspnea.  Sonographer:    Norleen Amour Referring Phys: HUSAM M SALAH IMPRESSIONS  1. Left ventricular ejection fraction, by estimation, is 60 to 65%. The left ventricle has normal function. The left ventricle has no regional wall motion abnormalities. Left ventricular diastolic function could not be evaluated.  2. Right ventricular systolic function is normal. The right ventricular size is normal.  3. The mitral valve is normal in structure. No evidence of mitral valve regurgitation. No evidence of mitral stenosis.  4. The aortic valve is tricuspid. Aortic valve regurgitation is not visualized. Aortic valve sclerosis is present, with no evidence of aortic valve stenosis. Aortic valve area, by VTI measures 2.25 cm. Aortic valve mean gradient measures 4.0 mmHg. Aortic valve Vmax measures 1.44 m/s.  5. The inferior vena cava is normal in size with greater than 50% respiratory variability, suggesting right atrial pressure of 3 mmHg.  6. Patient appears to be in atrial fibrillation. FINDINGS  Left Ventricle: Left ventricular ejection fraction, by estimation, is 60 to 65%. The left ventricle has normal function. The left ventricle has no regional wall motion abnormalities. The left ventricular internal cavity size was normal in size. There is  no left ventricular hypertrophy. Left ventricular diastolic function could not be evaluated due to atrial fibrillation. Left ventricular diastolic  function could not  be evaluated. Right Ventricle: The right ventricular size is normal. No increase in right ventricular wall thickness. Right ventricular systolic function is normal. Left Atrium: Left atrial size was normal in size. Right Atrium: Right atrial size was normal in size. Pericardium: There is no evidence of pericardial effusion. Mitral Valve: The mitral valve is normal in structure. No evidence of mitral valve regurgitation. No evidence of mitral valve stenosis. MV peak gradient, 7.6 mmHg. The mean mitral valve gradient is 2.0 mmHg. Tricuspid Valve: The tricuspid valve is normal in structure. Tricuspid valve regurgitation is trivial. No evidence of tricuspid stenosis. Aortic Valve: The aortic valve is tricuspid. Aortic valve regurgitation is not visualized. Aortic valve sclerosis is present, with no evidence of aortic valve stenosis. Aortic valve mean gradient measures 4.0 mmHg. Aortic valve peak gradient measures 8.3  mmHg. Aortic valve area, by VTI measures 2.25 cm. Pulmonic Valve: The pulmonic valve was normal in structure. Pulmonic valve regurgitation is trivial. No evidence of pulmonic stenosis. Aorta: The aortic root is normal in size and structure. Venous: The inferior vena cava is normal in size with greater than 50% respiratory variability, suggesting right atrial pressure of 3 mmHg. IAS/Shunts: No atrial level shunt detected by color flow Doppler.  LEFT VENTRICLE PLAX 2D LVIDd:         4.60 cm      Diastology LVIDs:         2.60 cm      LV e' medial:    11.00 cm/s LV PW:         1.00 cm      LV E/e' medial:  13.1 LV IVS:        0.90 cm      LV e' lateral:   10.20 cm/s LVOT diam:     1.90 cm      LV E/e' lateral: 14.1 LV SV:         69 LV SV Index:   31 LVOT Area:     2.84 cm LV IVRT:       88 msec  LV Volumes (MOD) LV vol d, MOD A2C: 137.0 ml LV vol d, MOD A4C: 142.0 ml LV vol s, MOD A2C: 29.4 ml LV vol s, MOD A4C: 35.4 ml LV SV MOD A2C:     107.6 ml LV SV MOD A4C:     142.0 ml LV SV MOD  BP:      106.0 ml RIGHT VENTRICLE             IVC RV Basal diam:  3.70 cm     IVC diam: 1.70 cm RV S prime:     13.80 cm/s TAPSE (M-mode): 2.1 cm      PULMONARY VEINS                             Diastolic Velocity: 74.30 cm/s                             S/D Velocity:       0.50                             Systolic Velocity:  36.90 cm/s LEFT ATRIUM           Index        RIGHT ATRIUM  Index LA diam:      3.20 cm 1.41 cm/m   RA Area:     14.30 cm LA Vol (A2C): 39.8 ml 17.55 ml/m  RA Volume:   35.90 ml  15.83 ml/m LA Vol (A4C): 60.5 ml 26.68 ml/m  AORTIC VALVE                    PULMONIC VALVE AV Area (Vmax):    2.01 cm     PV Vmax:       0.99 m/s AV Area (Vmean):   2.22 cm     PV Peak grad:  3.9 mmHg AV Area (VTI):     2.25 cm AV Vmax:           144.00 cm/s AV Vmean:          90.200 cm/s AV VTI:            0.309 m AV Peak Grad:      8.3 mmHg AV Mean Grad:      4.0 mmHg LVOT Vmax:         102.00 cm/s LVOT Vmean:        70.700 cm/s LVOT VTI:          0.245 m LVOT/AV VTI ratio: 0.79  AORTA Ao Root diam: 3.00 cm Ao Asc diam:  3.20 cm MITRAL VALVE MV Area (PHT): 3.42 cm     SHUNTS MV Area VTI:   2.06 cm     Systemic VTI:  0.24 m MV Peak grad:  7.6 mmHg     Systemic Diam: 1.90 cm MV Mean grad:  2.0 mmHg MV Vmax:       1.38 m/s MV Vmean:      55.5 cm/s MV Decel Time: 222 msec MV E velocity: 144.00 cm/s MV A velocity: 27.20 cm/s MV E/A ratio:  5.29 Wilbert Bihari MD Electronically signed by Wilbert Bihari MD Signature Date/Time: 10/14/2024/5:07:11 PM    Final    DG Chest Port 1 View Result Date: 10/13/2024 EXAM: 1 VIEW(S) XRAY OF THE CHEST 10/13/2024 06:31:00 PM COMPARISON: 09/30/2024 CLINICAL HISTORY: sob FINDINGS: LUNGS AND PLEURA: No focal pulmonary opacity. No pulmonary edema. No pleural effusion. No pneumothorax. HEART AND MEDIASTINUM: No acute abnormality of the cardiac and mediastinal silhouettes. BONES AND SOFT TISSUES: No acute osseous abnormality. IMPRESSION: 1. No acute cardiopulmonary process.  Electronically signed by: Pinkie Pebbles MD 10/13/2024 07:00 PM EST RP Workstation: [id number]    Cardiac Studies  LHC 10/2023:   Mid RCA lesion is 20% stenosed.   Mid LAD lesion is 20% stenosed.   Mild non-obstructive CAD   TTE 10/14/24:  1. Left ventricular ejection fraction, by estimation, is 60 to 65%. The  left ventricle has normal function. The left ventricle has no regional  wall motion abnormalities. Left ventricular diastolic function could not  be evaluated.   2. Right ventricular systolic function is normal. The right ventricular  size is normal.   3. The mitral valve is normal in structure. No evidence of mitral valve  regurgitation. No evidence of mitral stenosis.   4. The aortic valve is tricuspid. Aortic valve regurgitation is not  visualized. Aortic valve sclerosis is present, with no evidence of aortic  valve stenosis. Aortic valve area, by VTI measures 2.25 cm. Aortic valve  mean gradient measures 4.0 mmHg.  Aortic valve Vmax measures 1.44 m/s.   5. The inferior vena cava is normal in size with greater than 50%  respiratory variability, suggesting right atrial  pressure of 3 mmHg.   6. Patient appears to be in atrial fibrillation.   Patient Profile   78 y.o. male with non-obstructive CAD, HTN, HL, PAF, and OSA admitted with shortness of breath.    Assessment & Plan   # Persistent AF:  # Second degree heart block.  Diagnosed 09/30/24 in the setting of bronchitis.  Bronchitis has resolved but he has persistent exertional dyspnea.  He never started the prescribed Eliquis.  Discussed the importance of taking this daily as prescribed.  He is interested in a Watchman device long term.  He underwent successful DCCV this am but now in 2:1 AV block.  Ventricular complexes remain narrow.  He is not symptomatic when sitting.  Will see what his rate does with ambulation.  Will ask EP to see.  I suspect that if his heart rate augments with exertion and he is asymptomatic,  will wait on PPM.  Will need one if symptomatic.  He also needs his OSA treated.    # OSA: Planning to go for [facility] consult later this month.  He understands he must be on uninterrupted anticoagulation x1 month post DCCV.   # Acute heart failure, type unknown:  Pro-BNP 3179.  He isn't volume overloaded on exam and RA pressure 3 mmHg on echo.  No pulmonary edema on CXR. IV fluids were stopped yesterday.  No diuresis necessary.   # Hypertension:  BP stable on amlodipine.  # Hyperlipidemia:  Continue Zetia . Minimal non-obstructive CAD on prior cath.     For questions or updates, please contact [facility] Please consult [email or web address] for contact info under       Signed, Annabella Scarce, MD  10/15/2024, 11:44 AM

## 2024-10-15 NOTE — Interval H&P Note (Signed)
 History and Physical Interval Note:  10/15/2024 8:37 AM  Brandon Burns  has presented today for surgery, with the diagnosis of afib.  The various methods of treatment have been discussed with the patient and family. After consideration of risks, benefits and other options for treatment, the patient has consented to  Procedure(s): TRANSESOPHAGEAL ECHOCARDIOGRAM (N/A) CARDIOVERSION (N/A) as a surgical intervention.  The patient's history has been reviewed, patient examined, no change in status, stable for surgery.  I have reviewed the patient's chart and labs.  Questions were answered to the patient's satisfaction.     Georganna Archer

## 2024-10-15 NOTE — Anesthesia Postprocedure Evaluation (Signed)
 Anesthesia Post Note  Patient: Brandon Burns [facility]  Procedure(s) Performed: TRANSESOPHAGEAL ECHOCARDIOGRAM CARDIOVERSION     Patient location during evaluation: PACU Anesthesia Type: MAC Level of consciousness: awake and alert Pain management: pain level controlled Vital Signs Assessment: post-procedure vital signs reviewed and stable Respiratory status: spontaneous breathing, nonlabored ventilation, respiratory function stable and patient connected to nasal cannula oxygen Cardiovascular status: stable and blood pressure returned to baseline Postop Assessment: no apparent nausea or vomiting Anesthetic complications: no   No notable events documented.  Last Vitals:  Vitals:   10/15/24 1122 10/15/24 1314  BP: (!) 131/49 (!) 149/59  Pulse:  (!) 53  Resp: 18 20  Temp: 36.5 C 36.5 C  SpO2:  99%    Last Pain:  Vitals:   10/15/24 1314  TempSrc: Oral  PainSc: 0-No pain                 Brandon Burns

## 2024-10-15 NOTE — Consult Note (Signed)
 Cardiology Consultation   Patient ID: Brandon Burns MRN: [id number]; DOB: [birth year]  Admit date: 10/13/2024 Date of Consult: 10/15/2024  PCP:  Millicent Sharper, MD   [facility] Providers Cardiologist:  Soyla DELENA Merck, MD        Patient Profile: Brandon Burns is a 78 y.o. male with a hx of persistent atrial fib who is being seen 10/15/2024 for the evaluation of high grade heart block at the request of Dr. Floretta.  History of Present Illness: Brandon Burns is a pleasant 78 yo man with a h/o persistent atrial fib and a controlled VR who was admitted with worsening dyspnea and was placed on Eliquis and underwent TEE guided DCCV back to NSR. He was noted to have both 2;1 and CHB and we are consulted for additional evaluation. He is active and has not had syncope and denies chest pain. He did have sob which prompted his admit 2 days ago. He was found to be in atrial if and placed on anti-coagulation and underwent DCCV earlier today, restoring NSR. He feels better.    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   Lumbar degenerative disc disease 04/18/2017   Mixed hyperlipidemia 09/25/2019   Stage 3 chronic kidney disease (HCC) 01/06/2018   Vitamin D deficiency 12/05/2020    Past Surgical History:  Procedure Laterality Date   BACK SURGERY     CHOLECYSTECTOMY     HEMORRHOID SURGERY     LEFT HEART CATH AND CORONARY ANGIOGRAPHY N/A 10/19/2023   Procedure: LEFT HEART CATH AND CORONARY ANGIOGRAPHY;  Surgeon: Verlin Lonni BIRCH, MD;  Location: [facility] INVASIVE [facility];  Service: Cardiovascular;  Laterality: N/A;   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        Scheduled Meds:  amLODipine  2.5 mg Oral Daily   apixaban  5 mg Oral BID   pantoprazole  40 mg Oral Daily   sodium chloride  flush  3 mL Intravenous Q12H   tiZANidine   4 mg Oral QHS   zolpidem   5 mg Oral QHS   Continuous Infusions:  PRN Meds: acetaminophen  **OR** acetaminophen , ondansetron  **OR** ondansetron  (ZOFRAN ) IV, senna-docusate  Allergies:    Allergies  Allergen Reactions   Atorvastatin Other (See Comments)   Pravastatin Other (See Comments)   Rosuvastatin  Other (See Comments)    Social History:   Social History   Socioeconomic History   Marital status: Widowed    Spouse name: Not on file   Number of children: Not on file   Years of education: Not on file   Highest education level: Not on file  Occupational History   Not on file  Tobacco Use   Smoking status: Never   Smokeless tobacco: Never  Vaping Use   Vaping status: Never Used  Substance and Sexual Activity   Alcohol use: No   Drug use: No   Sexual activity: Yes    Partners: Female  Other Topics Concern   Not on file  Social History Narrative   Not on file   Social Drivers of Health   Financial Resource Strain: Not on file  Food Insecurity: No Food Insecurity (10/14/2024)   Hunger Vital Sign  Worried About Programme Researcher, Broadcasting/film/video in the Last Year: Never true    Ran Out of Food in the Last Year: Never true  Transportation Needs: No Transportation Needs (10/14/2024)   PRAPARE - Administrator, Civil Service (Medical): No    Lack of Transportation (Non-Medical): No  Physical Activity: Not on file  Stress: Not on file  Social Connections: Socially Integrated (10/14/2024)   Social Connection and Isolation Panel    Frequency of Communication with Friends and Family: More than three times a week    Frequency of Social Gatherings with Friends and Family: Twice a week    Attends Religious Services: More than 4 times per year    Active Member of [employer] or Organizations: Yes    Attends  Banker Meetings: 1 to 4 times per year    Marital Status: Married  Catering Manager Violence: Not At Risk (10/14/2024)   Humiliation, Afraid, Rape, and Kick questionnaire    Fear of Current or Ex-Partner: No    Emotionally Abused: No    Physically Abused: No    Sexually Abused: No    Family History:   No premature CADHistory reviewed. No pertinent family history.   ROS:  Please see the history of present illness.   All other ROS reviewed and negative.     Physical Exam/Data: Vitals:   10/15/24 1021 10/15/24 1022 10/15/24 1122 10/15/24 1314  BP:  124/60 (!) 131/49 (!) 149/59  Pulse:    (!) 53  Resp:  18 18 20   Temp:  97.8 F (36.6 C) 97.7 F (36.5 C) 97.7 F (36.5 C)  TempSrc:  Oral Oral Oral  SpO2: 95% 99%  99%  Weight:      Height:        Intake/Output Summary (Last 24 hours) at 10/15/2024 1357 Last data filed at 10/15/2024 1300 Gross per 24 hour  Intake 920 ml  Output 550 ml  Net 370 ml      10/13/2024    9:43 PM 10/13/2024    6:05 PM 10/04/2024   10:58 AM  Last 3 Weights  Weight (lbs) 226 lb 3.2 oz 228 lb 6.4 oz 228 lb 6.4 oz  Weight (kg) 102.604 kg 103.602 kg 103.602 kg     Body mass index is 29.84 kg/m.  General:  Well nourished, well developed, in no acute distress HEENT: normal Neck: no JVD Vascular: No carotid bruits; Distal pulses 2+ bilaterally Cardiac:  reg brady Lungs:  clear to auscultation bilaterally, no wheezing, rhonchi or rales  Abd: soft, nontender, no hepatomegaly  Ext: no edema Musculoskeletal:  No deformities, BUE and BLE strength normal and equal Skin: warm and dry  Neuro:  CNs 2-12 intact, no focal abnormalities noted Psych:  Normal affect   EKG:  The EKG was personally reviewed and demonstrates:  NSR with CHB Telemetry:  Telemetry was personally reviewed and demonstrates:  NSR with CHB and 2:1 AV block and   Relevant CV Studies: TEE noted  Laboratory Data: High Sensitivity Troponin:  No results for input(s):  TROPONINIHS in the last 720 hours.   Chemistry Recent Labs  Lab 10/13/24 1811 10/14/24 0249 10/15/24 0244  NA 139 139 137  K 4.0 3.6 3.5  CL 104 106 104  CO2 23 25 26   GLUCOSE 120* 118* 106*  BUN 18 16 17   CREATININE 1.37* 1.45* 1.46*  CALCIUM  9.1 8.9 8.6*  MG  --  1.8 2.0  GFRNONAA 53* 50* 49*  ANIONGAP 12  8 7    Recent Labs  Lab 10/13/24 1811  PROT 6.4*  ALBUMIN 4.0  AST 25  ALT 37  ALKPHOS 72  BILITOT 0.6   Lipids No results for input(s): CHOL, TRIG, HDL, LABVLDL, LDLCALC, CHOLHDL in the last 168 hours.  Hematology Recent Labs  Lab 10/13/24 1811 10/14/24 0249 10/15/24 0244  WBC 11.8* 10.7* 10.9*  RBC 4.40 4.18* 4.18*  HGB 13.2 12.5* 12.5*  HCT 38.6* 36.7* 36.9*  MCV 87.7 87.8 88.3  MCH 30.0 29.9 29.9  MCHC 34.2 34.1 33.9  RDW 14.2 14.1 14.2  PLT 186 177 186   Thyroid No results for input(s): TSH, FREET4 in the last 168 hours.  BNP Recent Labs  Lab 10/13/24 1811  PROBNP 3,179.0*    DDimer No results for input(s): DDIMER in the last 168 hours.  Radiology/Studies:  ECHO TEE Result Date: 10/15/2024    TRANSESOPHOGEAL ECHO REPORT   Patient Name:   KONNER SAIZ [facility] Date of Exam: 10/15/2024 Medical Rec #:  [id number]      Height:       73.0 in Accession #:    [id number]     Weight:       226.2 lb Date of Birth:  1946-06-10      BSA:          2.267 m Patient Age:    78 years       BP:           134/77 mmHg Patient Gender: M              HR:           46 bpm. Exam Location:  Inpatient Procedure: Transesophageal Echo, Cardiac Doppler and Color Doppler (Both            Spectral and Color Flow Doppler were utilized during procedure). Indications:     Atrial Fibrillation  History:         Patient has prior history of Echocardiogram examinations, most                  recent 10/14/2024. CAD, CKD, Arrythmias:Atrial Fibrillation,                  Signs/Symptoms:Dyspnea; Risk Factors:Dyslipidemia and                  Hypertension.  Sonographer:      Koleen Popper RDCS Referring Phys:  [id number] [facility] Diagnosing Phys: Georganna Archer  Sonographer Comments: Image acquisition challenging due to respiratory motion. PROCEDURE: After discussion of the risks and benefits of a TEE, an informed consent was obtained from the patient. The transesophogeal probe was passed without difficulty through the esophogus of the patient. Imaged were obtained with the patient in a left lateral decubitus position. Local oropharyngeal anesthetic was provided with Cetacaine. Sedation performed by different physician. The patient was monitored while under deep sedation. Anesthestetic sedation was provided intravenously by Anesthesiology: 296mg  of Propofol, 50mg  of Lidocaine . Image quality was adequate. The patient developed no complications during the procedure. A successful direct current cardioversion was performed at 360 joules with 1 attempt. Transgastric views not obtained: patient did not tolerate.  IMPRESSIONS  1. Left ventricular ejection fraction, by estimation, is 60 to 65%. The left ventricle has normal function. The left ventricle has no regional wall motion abnormalities.  2. Right ventricular systolic function is normal. The right ventricular size is normal.  3. No left atrial/left atrial appendage thrombus was detected. The LAA  emptying velocity was 55 cm/s.  4. The mitral valve is normal in structure. Mild mitral valve regurgitation. No evidence of mitral stenosis.  5. The aortic valve is normal in structure. Aortic valve regurgitation is trivial. Conclusion(s)/Recommendation(s): Normal biventricular function without evidence of hemodynamically significant valvular heart disease. No LA/LAA thrombus identified. Successful cardioversion performed with restoration of normal sinus rhythm. FINDINGS  Left Ventricle: Left ventricular ejection fraction, by estimation, is 60 to 65%. The left ventricle has normal function. The left ventricle has no regional wall motion  abnormalities. The left ventricular internal cavity size was normal in size. Right Ventricle: The right ventricular size is normal. No increase in right ventricular wall thickness. Right ventricular systolic function is normal. Left Atrium: Left atrial size was normal in size. No left atrial/left atrial appendage thrombus was detected. The LAA emptying velocity was 55 cm/s. Right Atrium: Right atrial size was normal in size. Pericardium: There is no evidence of pericardial effusion. Mitral Valve: The mitral valve is normal in structure. Mild mitral valve regurgitation. No evidence of mitral valve stenosis. Tricuspid Valve: The tricuspid valve is normal in structure. Tricuspid valve regurgitation is trivial. Aortic Valve: The aortic valve is normal in structure. Aortic valve regurgitation is trivial. Pulmonic Valve: The pulmonic valve was normal in structure. Pulmonic valve regurgitation is mild. Aorta: The aortic root and ascending aorta are structurally normal, with no evidence of dilitation. IAS/Shunts: No atrial level shunt detected by color flow Doppler. Additional Comments: Spectral Doppler performed. LEFT VENTRICLE PLAX 2D LVOT diam:     1.90 cm LVOT Area:     2.84 cm   AORTA Ao Root diam: 3.30 cm Ao Asc diam:  3.60 cm Ao Arch diam: 3.5 cm  SHUNTS Systemic Diam: 1.90 cm Georganna Archer Electronically signed by Georganna Archer Signature Date/Time: 10/15/2024/1:50:58 PM    Final    EP STUDY Result Date: 10/15/2024 See surgical note for result.  ECHOCARDIOGRAM COMPLETE Result Date: 10/14/2024    ECHOCARDIOGRAM REPORT   Patient Name:   ERYX ZANE Date of Exam: 10/14/2024 Medical Rec #:  [id number]      Height:       73.0 in Accession #:    [id number]     Weight:       226.2 lb Date of Birth:  [birth year]      BSA:          2.267 m Patient Age:    78 years       BP:           113/66 mmHg Patient Gender: M              HR:           52 bpm. Exam Location:  Inpatient Procedure: 2D Echo and Intracardiac  Opacification Agent (Both Spectral and Color            Flow Doppler were utilized during procedure). Indications:    Dyspnea  History:        Patient has prior history of Echocardiogram examinations.                 Signs/Symptoms:Dyspnea.  Sonographer:    Norleen Amour Referring Phys: HUSAM M SALAH IMPRESSIONS  1. Left ventricular ejection fraction, by estimation, is 60 to 65%. The left ventricle has normal function. The left ventricle has no regional wall motion abnormalities. Left ventricular diastolic function could not be evaluated.  2. Right ventricular systolic function is normal. The right ventricular size is normal.  3. The mitral valve is normal in structure. No evidence of mitral valve regurgitation. No evidence of mitral stenosis.  4. The aortic valve is tricuspid. Aortic valve regurgitation is not visualized. Aortic valve sclerosis is present, with no evidence of aortic valve stenosis. Aortic valve area, by VTI measures 2.25 cm. Aortic valve mean gradient measures 4.0 mmHg. Aortic valve Vmax measures 1.44 m/s.  5. The inferior vena cava is normal in size with greater than 50% respiratory variability, suggesting right atrial pressure of 3 mmHg.  6. Patient appears to be in atrial fibrillation. FINDINGS  Left Ventricle: Left ventricular ejection fraction, by estimation, is 60 to 65%. The left ventricle has normal function. The left ventricle has no regional wall motion abnormalities. The left ventricular internal cavity size was normal in size. There is  no left ventricular hypertrophy. Left ventricular diastolic function could not be evaluated due to atrial fibrillation. Left ventricular diastolic function could not be evaluated. Right Ventricle: The right ventricular size is normal. No increase in right ventricular wall thickness. Right ventricular systolic function is normal. Left Atrium: Left atrial size was normal in size. Right Atrium: Right atrial size was normal in size. Pericardium: There is no  evidence of pericardial effusion. Mitral Valve: The mitral valve is normal in structure. No evidence of mitral valve regurgitation. No evidence of mitral valve stenosis. MV peak gradient, 7.6 mmHg. The mean mitral valve gradient is 2.0 mmHg. Tricuspid Valve: The tricuspid valve is normal in structure. Tricuspid valve regurgitation is trivial. No evidence of tricuspid stenosis. Aortic Valve: The aortic valve is tricuspid. Aortic valve regurgitation is not visualized. Aortic valve sclerosis is present, with no evidence of aortic valve stenosis. Aortic valve mean gradient measures 4.0 mmHg. Aortic valve peak gradient measures 8.3  mmHg. Aortic valve area, by VTI measures 2.25 cm. Pulmonic Valve: The pulmonic valve was normal in structure. Pulmonic valve regurgitation is trivial. No evidence of pulmonic stenosis. Aorta: The aortic root is normal in size and structure. Venous: The inferior vena cava is normal in size with greater than 50% respiratory variability, suggesting right atrial pressure of 3 mmHg. IAS/Shunts: No atrial level shunt detected by color flow Doppler.  LEFT VENTRICLE PLAX 2D LVIDd:         4.60 cm      Diastology LVIDs:         2.60 cm      LV e' medial:    11.00 cm/s LV PW:         1.00 cm      LV E/e' medial:  13.1 LV IVS:        0.90 cm      LV e' lateral:   10.20 cm/s LVOT diam:     1.90 cm      LV E/e' lateral: 14.1 LV SV:         69 LV SV Index:   31 LVOT Area:     2.84 cm LV IVRT:       88 msec  LV Volumes (MOD) LV vol d, MOD A2C: 137.0 ml LV vol d, MOD A4C: 142.0 ml LV vol s, MOD A2C: 29.4 ml LV vol s, MOD A4C: 35.4 ml LV SV MOD A2C:     107.6 ml LV SV MOD A4C:     142.0 ml LV SV MOD BP:      106.0 ml RIGHT VENTRICLE             IVC RV Basal diam:  3.70 cm  IVC diam: 1.70 cm RV S prime:     13.80 cm/s TAPSE (M-mode): 2.1 cm      PULMONARY VEINS                             Diastolic Velocity: 74.30 cm/s                             S/D Velocity:       0.50                              Systolic Velocity:  36.90 cm/s LEFT ATRIUM           Index        RIGHT ATRIUM           Index LA diam:      3.20 cm 1.41 cm/m   RA Area:     14.30 cm LA Vol (A2C): 39.8 ml 17.55 ml/m  RA Volume:   35.90 ml  15.83 ml/m LA Vol (A4C): 60.5 ml 26.68 ml/m  AORTIC VALVE                    PULMONIC VALVE AV Area (Vmax):    2.01 cm     PV Vmax:       0.99 m/s AV Area (Vmean):   2.22 cm     PV Peak grad:  3.9 mmHg AV Area (VTI):     2.25 cm AV Vmax:           144.00 cm/s AV Vmean:          90.200 cm/s AV VTI:            0.309 m AV Peak Grad:      8.3 mmHg AV Mean Grad:      4.0 mmHg LVOT Vmax:         102.00 cm/s LVOT Vmean:        70.700 cm/s LVOT VTI:          0.245 m LVOT/AV VTI ratio: 0.79  AORTA Ao Root diam: 3.00 cm Ao Asc diam:  3.20 cm MITRAL VALVE MV Area (PHT): 3.42 cm     SHUNTS MV Area VTI:   2.06 cm     Systemic VTI:  0.24 m MV Peak grad:  7.6 mmHg     Systemic Diam: 1.90 cm MV Mean grad:  2.0 mmHg MV Vmax:       1.38 m/s MV Vmean:      55.5 cm/s MV Decel Time: 222 msec MV E velocity: 144.00 cm/s MV A velocity: 27.20 cm/s MV E/A ratio:  5.29 Wilbert Bihari MD Electronically signed by Wilbert Bihari MD Signature Date/Time: 10/14/2024/5:07:11 PM    Final    DG Chest Port 1 View Result Date: 10/13/2024 EXAM: 1 VIEW(S) XRAY OF THE CHEST 10/13/2024 06:31:00 PM COMPARISON: 09/30/2024 CLINICAL HISTORY: sob FINDINGS: LUNGS AND PLEURA: No focal pulmonary opacity. No pulmonary edema. No pleural effusion. No pneumothorax. HEART AND MEDIASTINUM: No acute abnormality of the cardiac and mediastinal silhouettes. BONES AND SOFT TISSUES: No acute osseous abnormality. IMPRESSION: 1. No acute cardiopulmonary process. 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 The timing is the issue. I would recommend he continue his blood thinners  2-3 more weeks, then hold for 2 days and return for PPM. My office will coordinate. Systemic anti-coag - Because he is on chronic anti-coagulation for the past couple of days, and freshly cardioverted, I would recommend he continue the eliquis and we will have him return for PPM in a couple of weeks.   Danelle Waddell COME For questions or updates, please contact [facility] Please consult [email or web address] for contact info under    Signed, Danelle Waddell, MD  10/15/2024 1:57 PM

## 2024-10-16 ENCOUNTER — Encounter (HOSPITAL_COMMUNITY): Payer: Self-pay | Admitting: Student in an Organized Health Care Education/Training Program

## 2024-10-16 DIAGNOSIS — I4891 Unspecified atrial fibrillation: Secondary | ICD-10-CM | POA: Diagnosis not present

## 2024-10-16 LAB — CBC
HCT: 38.3 % — ABNORMAL LOW (ref 39.0–52.0)
Hemoglobin: 12.7 g/dL — ABNORMAL LOW (ref 13.0–17.0)
MCH: 29.4 pg (ref 26.0–34.0)
MCHC: 33.2 g/dL (ref 30.0–36.0)
MCV: 88.7 fL (ref 80.0–100.0)
Platelets: 182 K/uL (ref 150–400)
RBC: 4.32 MIL/uL (ref 4.22–5.81)
RDW: 14.1 % (ref 11.5–15.5)
WBC: 10.1 K/uL (ref 4.0–10.5)
nRBC: 0 % (ref 0.0–0.2)

## 2024-10-16 LAB — BASIC METABOLIC PANEL WITH GFR
Anion gap: 11 (ref 5–15)
BUN: 18 mg/dL (ref 8–23)
CO2: 24 mmol/L (ref 22–32)
Calcium: 8.6 mg/dL — ABNORMAL LOW (ref 8.9–10.3)
Chloride: 105 mmol/L (ref 98–111)
Creatinine, Ser: 1.58 mg/dL — ABNORMAL HIGH (ref 0.61–1.24)
GFR, Estimated: 45 mL/min — ABNORMAL LOW (ref >60)
Glucose, Bld: 109 mg/dL — ABNORMAL HIGH (ref 70–99)
Potassium: 3.5 mmol/L (ref 3.5–5.1)
Sodium: 140 mmol/L (ref 135–145)

## 2024-10-16 NOTE — Care Management Important Message (Signed)
 Important Message  Patient Details  Name: NICKLOS GAXIOLA MRN: [id number] Date of Birth: 11-26-1946   Important Message Given:  Yes - Medicare IM     Vonzell Arrie Sharps 10/16/2024, 10:51 AM

## 2024-10-16 NOTE — H&P (View-Only) (Signed)
  Patient Name: Brandon Burns Date of Encounter: 10/16/2024  Primary Cardiologist: Soyla DELENA Merck, MD Electrophysiologist: Dr. Waddell   Interval Summary   The patient is doing well today.  He walked all over the hospital and feels well. No lightheadedness or dizziness. At this time, the patient denies chest pain, shortness of breath, or any new concerns.  Vital Signs    Vitals:   10/15/24 1641 10/15/24 1928 10/16/24 0004 10/16/24 0947  BP: (!) 154/60 (!) 151/64 (!) 131/47 (!) 122/58  Pulse: (!) 43 (!) 42 (!) 43 (!) 43  Resp: 18 18 17 18   Temp: 98.1 F (36.7 C) 98.7 F (37.1 C) 98.7 F (37.1 C) (!) 97.5 F (36.4 C)  TempSrc: Oral Oral Oral Oral  SpO2: 98% 98% 96% 100%  Weight:      Height:        Intake/Output Summary (Last 24 hours) at 10/16/2024 1158 Last data filed at 10/15/2024 1300 Gross per 24 hour  Intake 240 ml  Output --  Net 240 ml   Filed Weights   10/13/24 1805 10/13/24 2143  Weight: 103.6 kg 102.6 kg    Physical Exam    GEN- NAD, Alert and oriented  Lungs- Clear to ausculation bilaterally, normal work of breathing Cardiac- Regular rate and rhythm, no murmurs, rubs or gallops GI- soft, NT, ND, + BS Extremities- no clubbing or cyanosis. No edema  Telemetry    2:1 and 3:2 AVB 40-70's (personally reviewed)  Hospital Course    Brandon Burns is a 78 y.o. male with pMH of persistent atrial fibrillation admitted for high grade AVB.   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 Waddell -the patient is asymptomatic and we reviewed symptoms of high grade AVB and return to ER precautions -continue OAC until 2 days before procedure, pt instructed to hold. Written instructions for procedure given to patient.  -pt instructed not to perform  strenuous activities or heavy lifting, no driving until after PPM     For questions or updates, please contact [facility] Please consult [email or web address] for contact info under     Signed, Daphne Barrack, NP-C, AGACNP-BC [facility] - Electrophysiology  10/16/2024, 11:58 AM   EP Attending  Patient seen and examined. Agree with above. CV reveals an Ireg rhythm and lungs are clear. Tele shows mostly AVWB and at times 2:1 AV block after DCCV. He feels well and is stable for DC home. He will avoid AV nodal blocking drugs and return in about 3 weeks for PPM insertion. He will hold his systemic anti-coag 2 days before his PPM.  Danelle Darin Arndt,MD

## 2024-10-16 NOTE — Progress Notes (Addendum)
 Notified Dr. Cathaleen CCMD reported Pt. In third degree heart block. EKG done and shows Second degree type one Copy placed in chart.

## 2024-10-16 NOTE — TOC Transition Note (Signed)
 Transition of Care [facility]) - Discharge Note   Patient Details  Name: Brandon Burns MRN: [id number] Date of Birth: Oct 17, 1946  Transition of [facility]) CM/SW Contact:  Waddell Barnie Rama, RN Phone Number: 10/16/2024, 2:44 PM   Clinical Narrative:    For dc today, wife is at the bedside to transport him home today.          Patient Goals and CMS Choice            Discharge Placement                       Discharge Plan and Services Additional resources added to the After Visit Summary for                                       Social Drivers of Health (SDOH) Interventions SDOH Screenings   Food Insecurity: No Food Insecurity (10/14/2024)  Housing: Low Risk  (10/14/2024)  Transportation Needs: No Transportation Needs (10/14/2024)  Utilities: Not At Risk (10/14/2024)  Social Connections: Socially Integrated (10/14/2024)  Tobacco Use: Low Risk  (10/13/2024)  Recent Concern: Tobacco Use - Medium Risk (10/10/2024)   Received from [facility]     Readmission Risk Interventions    10/16/2024    2:43 PM  Readmission Risk Prevention Plan  Transportation Screening Complete  PCP or Specialist Appt within 5-7 Days Complete  Home Care Screening Complete  Medication Review (RN CM) Complete

## 2024-10-16 NOTE — Progress Notes (Addendum)
  Patient Name: Brandon Burns Date of Encounter: 10/16/2024  Primary Cardiologist: Soyla DELENA Merck, MD Electrophysiologist: Dr. Waddell   Interval Summary   The patient is doing well today.  He walked all over the hospital and feels well. No lightheadedness or dizziness. At this time, the patient denies chest pain, shortness of breath, or any new concerns.  Vital Signs    Vitals:   10/15/24 1641 10/15/24 1928 10/16/24 0004 10/16/24 0947  BP: (!) 154/60 (!) 151/64 (!) 131/47 (!) 122/58  Pulse: (!) 43 (!) 42 (!) 43 (!) 43  Resp: 18 18 17 18   Temp: 98.1 F (36.7 C) 98.7 F (37.1 C) 98.7 F (37.1 C) (!) 97.5 F (36.4 C)  TempSrc: Oral Oral Oral Oral  SpO2: 98% 98% 96% 100%  Weight:      Height:        Intake/Output Summary (Last 24 hours) at 10/16/2024 1158 Last data filed at 10/15/2024 1300 Gross per 24 hour  Intake 240 ml  Output --  Net 240 ml   Filed Weights   10/13/24 1805 10/13/24 2143  Weight: 103.6 kg 102.6 kg    Physical Exam    GEN- NAD, Alert and oriented  Lungs- Clear to ausculation bilaterally, normal work of breathing Cardiac- Regular rate and rhythm, no murmurs, rubs or gallops GI- soft, NT, ND, + BS Extremities- no clubbing or cyanosis. No edema  Telemetry    2:1 and 3:2 AVB 40-70's (personally reviewed)  Hospital Course    OAKLEN THIAM is a 78 y.o. male with pMH of persistent atrial fibrillation admitted for high grade AVB.   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 Waddell -the patient is asymptomatic and we reviewed symptoms of high grade AVB and return to ER precautions -continue OAC until 2 days before procedure, pt instructed to hold. Written instructions for procedure given to patient.  -pt instructed not to perform  strenuous activities or heavy lifting, no driving until after PPM     For questions or updates, please contact [facility] Please consult [email or web address] for contact info under     Signed, Daphne Barrack, NP-C, AGACNP-BC [facility] - Electrophysiology  10/16/2024, 11:58 AM   EP Attending  Patient seen and examined. Agree with above. CV reveals an Ireg rhythm and lungs are clear. Tele shows mostly AVWB and at times 2:1 AV block after DCCV. He feels well and is stable for DC home. He will avoid AV nodal blocking drugs and return in about 3 weeks for PPM insertion. He will hold his systemic anti-coag 2 days before his PPM.  Danelle Darin Arndt,MD

## 2024-10-16 NOTE — Progress Notes (Signed)
 Mobility Specialist Progress Note:    10/16/24 1142  Mobility  Activity Ambulated independently  Level of Assistance Independent after set-up  Assistive Device None  Distance Ambulated (ft) 500 ft  Range of Motion/Exercises Active  Activity Response Tolerated well  Mobility Referral Yes  Mobility visit 1 Mobility  Mobility Specialist Start Time (ACUTE ONLY) 1142  Mobility Specialist Stop Time (ACUTE ONLY) 1152  Mobility Specialist Time Calculation (min) (ACUTE ONLY) 10 min   Received pt sitting EOB w/ wife in room agreeable to session. No c/o any symptoms. Pt moving and ambulating well. Noted high HR of 110's but pt asymptomatic. Returned pt to room w/ all needs met.   Venetia Keel Mobility Specialist Please Neurosurgeon or Rehab Office at [phone]

## 2024-10-16 NOTE — Discharge Summary (Signed)
 Physician Discharge Summary  Brandon Burns [facility] FMW:[id number] DOB: 1946-11-18 DOA: 10/13/2024  PCP: Millicent Sharper, MD  Admit date: 10/13/2024 Discharge date: 10/16/2024  Admitted From: Home Disposition: Home  Recommendations for Outpatient Follow-up:  Continue uninterrupted anticoagulation with Eliquis. Return to the ER if symptoms of dizziness, near syncope/syncope.  Home Health: No Equipment/Devices: None  Discharge Condition: Stable CODE STATUS: Full Diet recommendation: Heart healthy  Brief/Interim Summary:  Brandon Burns is an 78 y.o. male past medical history significant for essential hypertension, obstructive sleep apnea, chronic kidney disease stage III A recently diagnosed with atrial fibrillation on 09/30/2024 comes in with fatigue and exertional dyspnea, recently seen treated for acute bronchitis with steroids and antibiotics with no improvement.  He has been in persistent A-fib.  He was not taking Eliquis that was prescribed.  Evaluated by cardiology, underwent TEE cardioversion on 10/15/2024 back to NSR.  Was bradycardic postprocedure.  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  He was evaluated by EP and planned for permanent pacemaker placement after a couple of weeks of anticoagulation therapy(scheduled for December 3rd)  Patient discharged home in stable condition.  He is advised not to drive until pacemaker placement.   Diagnosis: Essential hypertension: On low-dose Norvasc CKD, at baseline CAD, stable with no anginal symptoms, euvolemic.  Not on any AV nodal blocker   Discharge Diagnoses:  Principal Problem:   Atrial fibrillation with slow ventricular response (HCC) Active Problems:   Hypertension   CKD stage 3a, GFR 45-59 ml/min (HCC)   CAD (coronary artery disease)   Second degree heart block    Discharge Instructions  Discharge Instructions     (HEART FAILURE PATIENTS) Call MD:  Anytime you have any of the following symptoms: 1) 3 pound weight  gain in 24 hours or 5 pounds in 1 week 2) shortness of breath, with or without a dry hacking cough 3) swelling in the hands, feet or stomach 4) if you have to sleep on extra pillows at night in order to breathe.   Complete by: As directed    Amb referral to AFIB Clinic   Complete by: As directed    Call MD for:  persistant dizziness or light-headedness   Complete by: As directed    Diet - low sodium heart healthy   Complete by: As directed    Discharge instructions   Complete by: As directed    1.  Continue Eliquis 5 mg twice daily uninterrupted. 2.  Follow-up with cardiology for pacemaker 3.  Return to the ER if recurrence of symptoms.   Driving Restrictions   Complete by: As directed    Please avoid driving until pacemaker implantation.   Increase activity slowly   Complete by: As directed       Allergies as of 10/16/2024       Reactions   Atorvastatin Other (See Comments)   Pravastatin Other (See Comments)   Rosuvastatin  Other (See Comments)        Medication List     TAKE these medications    acetaminophen  500 MG tablet Commonly known as: TYLENOL  Take 1,000 mg by mouth every 6 (six) hours as needed for moderate pain (pain score 4-6).   amLODipine 2.5 MG tablet Commonly known as: NORVASC Take 2.5 mg by mouth daily.   apixaban 5 MG Tabs tablet Commonly known as: ELIQUIS Take 1 tablet (5 mg total) by mouth 2 (two) times daily.   benzonatate  200 MG capsule Commonly known as: TESSALON  Take 200 mg by mouth 2 (  two) times daily as needed for cough.   ezetimibe  10 MG tablet Commonly known as: ZETIA  Take 1 tablet (10 mg total) by mouth daily.   fexofenadine 180 MG tablet Commonly known as: ALLEGRA Take 180 mg by mouth daily.   fluticasone  50 MCG/ACT nasal spray Commonly known as: FLONASE  Place 1 spray into both nostrils 2 (two) times daily.   Lumify 0.025 % Soln Generic drug: Brimonidine Tartrate Place 1 drop into both eyes daily.   omeprazole 20 MG  capsule Commonly known as: PRILOSEC Take 20 mg by mouth daily.   tiZANidine  4 MG tablet Commonly known as: ZANAFLEX  Take 4 mg by mouth at bedtime.   vitamin B-12 500 MCG tablet Commonly known as: CYANOCOBALAMIN Take 500 mcg by mouth daily.   Vitamin D3 50 MCG (2000 UT) Tabs Take 2,000 Units by mouth daily.   zolpidem  10 MG tablet Commonly known as: AMBIEN  Take 10 mg by mouth at bedtime.        Follow-up Information     Millicent Sharper, MD. Schedule an appointment as soon as possible for a visit in 1 week(s).   Specialty: Family Medicine Why: clinic will call you to schedule this follow up Contact information: [address] [place] [address] [phone]                Allergies  Allergen Reactions   Atorvastatin Other (See Comments)   Pravastatin Other (See Comments)   Rosuvastatin  Other (See Comments)    Consultations:    Procedures/Studies: ECHO TEE Result Date: 10/15/2024    TRANSESOPHOGEAL ECHO REPORT   Patient Name:   Brandon Burns [facility] Date of Exam: 10/15/2024 Medical Rec #:  [id number]      Height:       73.0 in Accession #:    [id number]     Weight:       226.2 lb Date of Birth:  03-Jan-1946      BSA:          2.267 m Patient Age:    78 years       BP:           134/77 mmHg Patient Gender: M              HR:           46 bpm. Exam Location:  Inpatient Procedure: Transesophageal Echo, Cardiac Doppler and Color Doppler (Both            Spectral and Color Flow Doppler were utilized during procedure). Indications:     Atrial Fibrillation  History:         Patient has prior history of Echocardiogram examinations, most                  recent 10/14/2024. CAD, CKD, Arrythmias:Atrial Fibrillation,                  Signs/Symptoms:Dyspnea; Risk Factors:Dyslipidemia and                  Hypertension.  Sonographer:     Koleen Popper RDCS Referring Phys:  [id number] [facility] Rosalie Diagnosing Phys: Georganna Archer  Sonographer Comments: Image acquisition  challenging due to respiratory motion. PROCEDURE: After discussion of the risks and benefits of a TEE, an informed consent was obtained from the patient. The transesophogeal probe was passed without difficulty through the esophogus of the patient. Imaged were obtained with the patient in a left lateral decubitus position. Local oropharyngeal anesthetic  was provided with Cetacaine. Sedation performed by different physician. The patient was monitored while under deep sedation. Anesthestetic sedation was provided intravenously by Anesthesiology: 296mg  of Propofol, 50mg  of Lidocaine . Image quality was adequate. The patient developed no complications during the procedure. A successful direct current cardioversion was performed at 360 joules with 1 attempt. Transgastric views not obtained: patient did not tolerate.  IMPRESSIONS  1. Left ventricular ejection fraction, by estimation, is 60 to 65%. The left ventricle has normal function. The left ventricle has no regional wall motion abnormalities.  2. Right ventricular systolic function is normal. The right ventricular size is normal.  3. No left atrial/left atrial appendage thrombus was detected. The LAA emptying velocity was 55 cm/s.  4. The mitral valve is normal in structure. Mild mitral valve regurgitation. No evidence of mitral stenosis.  5. The aortic valve is normal in structure. Aortic valve regurgitation is trivial. Conclusion(s)/Recommendation(s): Normal biventricular function without evidence of hemodynamically significant valvular heart disease. No LA/LAA thrombus identified. Successful cardioversion performed with restoration of normal sinus rhythm. FINDINGS  Left Ventricle: Left ventricular ejection fraction, by estimation, is 60 to 65%. The left ventricle has normal function. The left ventricle has no regional wall motion abnormalities. The left ventricular internal cavity size was normal in size. Right Ventricle: The right ventricular size is normal. No  increase in right ventricular wall thickness. Right ventricular systolic function is normal. Left Atrium: Left atrial size was normal in size. No left atrial/left atrial appendage thrombus was detected. The LAA emptying velocity was 55 cm/s. Right Atrium: Right atrial size was normal in size. Pericardium: There is no evidence of pericardial effusion. Mitral Valve: The mitral valve is normal in structure. Mild mitral valve regurgitation. No evidence of mitral valve stenosis. Tricuspid Valve: The tricuspid valve is normal in structure. Tricuspid valve regurgitation is trivial. Aortic Valve: The aortic valve is normal in structure. Aortic valve regurgitation is trivial. Pulmonic Valve: The pulmonic valve was normal in structure. Pulmonic valve regurgitation is mild. Aorta: The aortic root and ascending aorta are structurally normal, with no evidence of dilitation. IAS/Shunts: No atrial level shunt detected by color flow Doppler. Additional Comments: Spectral Doppler performed. LEFT VENTRICLE PLAX 2D LVOT diam:     1.90 cm LVOT Area:     2.84 cm   AORTA Ao Root diam: 3.30 cm Ao Asc diam:  3.60 cm Ao Arch diam: 3.5 cm  SHUNTS Systemic Diam: 1.90 cm Georganna Archer Electronically signed by Georganna Archer Signature Date/Time: 10/15/2024/1:50:58 PM    Final    EP STUDY Result Date: 10/15/2024 See surgical note for result.  ECHOCARDIOGRAM COMPLETE Result Date: 10/14/2024    ECHOCARDIOGRAM REPORT   Patient Name:   Brandon Burns Date of Exam: 10/14/2024 Medical Rec #:  [id number]      Height:       73.0 in Accession #:    [id number]     Weight:       226.2 lb Date of Birth:  07/31/1946      BSA:          2.267 m Patient Age:    78 years       BP:           113/66 mmHg Patient Gender: M              HR:           52 bpm. Exam Location:  Inpatient Procedure: 2D Echo and Intracardiac Opacification Agent (Both Spectral and Color  Flow Doppler were utilized during procedure). Indications:    Dyspnea  History:         Patient has prior history of Echocardiogram examinations.                 Signs/Symptoms:Dyspnea.  Sonographer:    Norleen Amour Referring Phys: HUSAM M SALAH IMPRESSIONS  1. Left ventricular ejection fraction, by estimation, is 60 to 65%. The left ventricle has normal function. The left ventricle has no regional wall motion abnormalities. Left ventricular diastolic function could not be evaluated.  2. Right ventricular systolic function is normal. The right ventricular size is normal.  3. The mitral valve is normal in structure. No evidence of mitral valve regurgitation. No evidence of mitral stenosis.  4. The aortic valve is tricuspid. Aortic valve regurgitation is not visualized. Aortic valve sclerosis is present, with no evidence of aortic valve stenosis. Aortic valve area, by VTI measures 2.25 cm. Aortic valve mean gradient measures 4.0 mmHg. Aortic valve Vmax measures 1.44 m/s.  5. The inferior vena cava is normal in size with greater than 50% respiratory variability, suggesting right atrial pressure of 3 mmHg.  6. Patient appears to be in atrial fibrillation. FINDINGS  Left Ventricle: Left ventricular ejection fraction, by estimation, is 60 to 65%. The left ventricle has normal function. The left ventricle has no regional wall motion abnormalities. The left ventricular internal cavity size was normal in size. There is  no left ventricular hypertrophy. Left ventricular diastolic function could not be evaluated due to atrial fibrillation. Left ventricular diastolic function could not be evaluated. Right Ventricle: The right ventricular size is normal. No increase in right ventricular wall thickness. Right ventricular systolic function is normal. Left Atrium: Left atrial size was normal in size. Right Atrium: Right atrial size was normal in size. Pericardium: There is no evidence of pericardial effusion. Mitral Valve: The mitral valve is normal in structure. No evidence of mitral valve regurgitation. No  evidence of mitral valve stenosis. MV peak gradient, 7.6 mmHg. The mean mitral valve gradient is 2.0 mmHg. Tricuspid Valve: The tricuspid valve is normal in structure. Tricuspid valve regurgitation is trivial. No evidence of tricuspid stenosis. Aortic Valve: The aortic valve is tricuspid. Aortic valve regurgitation is not visualized. Aortic valve sclerosis is present, with no evidence of aortic valve stenosis. Aortic valve mean gradient measures 4.0 mmHg. Aortic valve peak gradient measures 8.3  mmHg. Aortic valve area, by VTI measures 2.25 cm. Pulmonic Valve: The pulmonic valve was normal in structure. Pulmonic valve regurgitation is trivial. No evidence of pulmonic stenosis. Aorta: The aortic root is normal in size and structure. Venous: The inferior vena cava is normal in size with greater than 50% respiratory variability, suggesting right atrial pressure of 3 mmHg. IAS/Shunts: No atrial level shunt detected by color flow Doppler.  LEFT VENTRICLE PLAX 2D LVIDd:         4.60 cm      Diastology LVIDs:         2.60 cm      LV e' medial:    11.00 cm/s LV PW:         1.00 cm      LV E/e' medial:  13.1 LV IVS:        0.90 cm      LV e' lateral:   10.20 cm/s LVOT diam:     1.90 cm      LV E/e' lateral: 14.1 LV SV:         69  LV SV Index:   31 LVOT Area:     2.84 cm LV IVRT:       88 msec  LV Volumes (MOD) LV vol d, MOD A2C: 137.0 ml LV vol d, MOD A4C: 142.0 ml LV vol s, MOD A2C: 29.4 ml LV vol s, MOD A4C: 35.4 ml LV SV MOD A2C:     107.6 ml LV SV MOD A4C:     142.0 ml LV SV MOD BP:      106.0 ml RIGHT VENTRICLE             IVC RV Basal diam:  3.70 cm     IVC diam: 1.70 cm RV S prime:     13.80 cm/s TAPSE (M-mode): 2.1 cm      PULMONARY VEINS                             Diastolic Velocity: 74.30 cm/s                             S/D Velocity:       0.50                             Systolic Velocity:  36.90 cm/s LEFT ATRIUM           Index        RIGHT ATRIUM           Index LA diam:      3.20 cm 1.41 cm/m   RA Area:      14.30 cm LA Vol (A2C): 39.8 ml 17.55 ml/m  RA Volume:   35.90 ml  15.83 ml/m LA Vol (A4C): 60.5 ml 26.68 ml/m  AORTIC VALVE                    PULMONIC VALVE AV Area (Vmax):    2.01 cm     PV Vmax:       0.99 m/s AV Area (Vmean):   2.22 cm     PV Peak grad:  3.9 mmHg AV Area (VTI):     2.25 cm AV Vmax:           144.00 cm/s AV Vmean:          90.200 cm/s AV VTI:            0.309 m AV Peak Grad:      8.3 mmHg AV Mean Grad:      4.0 mmHg LVOT Vmax:         102.00 cm/s LVOT Vmean:        70.700 cm/s LVOT VTI:          0.245 m LVOT/AV VTI ratio: 0.79  AORTA Ao Root diam: 3.00 cm Ao Asc diam:  3.20 cm MITRAL VALVE MV Area (PHT): 3.42 cm     SHUNTS MV Area VTI:   2.06 cm     Systemic VTI:  0.24 m MV Peak grad:  7.6 mmHg     Systemic Diam: 1.90 cm MV Mean grad:  2.0 mmHg MV Vmax:       1.38 m/s MV Vmean:      55.5 cm/s MV Decel Time: 222 msec MV E velocity: 144.00 cm/s MV A velocity: 27.20 cm/s MV E/A ratio:  5.29 Wilbert Bihari MD Electronically signed by Wilbert Bihari MD  Signature Date/Time: 10/14/2024/5:07:11 PM    Final    DG Chest Port 1 View Result Date: 10/13/2024 EXAM: 1 VIEW(S) XRAY OF THE CHEST 10/13/2024 06:31:00 PM COMPARISON: 09/30/2024 CLINICAL HISTORY: sob FINDINGS: LUNGS AND PLEURA: No focal pulmonary opacity. No pulmonary edema. No pleural effusion. No pneumothorax. HEART AND MEDIASTINUM: No acute abnormality of the cardiac and mediastinal silhouettes. BONES AND SOFT TISSUES: No acute osseous abnormality. IMPRESSION: 1. No acute cardiopulmonary process. Electronically signed by: Pinkie Pebbles MD 10/13/2024 07:00 PM EST RP Workstation: [id number]   CT Angio Chest PE W and/or Wo Contrast Result Date: 09/30/2024 CLINICAL DATA:  PE suspected, upper respiratory infection EXAM: CT ANGIOGRAPHY CHEST WITH CONTRAST TECHNIQUE: Multidetector CT imaging of the chest was performed using the standard protocol during bolus administration of intravenous contrast. Multiplanar CT image reconstructions and  MIPs were obtained to evaluate the vascular anatomy. RADIATION DOSE REDUCTION: This exam was performed according to the departmental dose-optimization program which includes automated exposure control, adjustment of the mA and/or kV according to patient size and/or use of iterative reconstruction technique. CONTRAST:  OMNIPAQUE  IOHEXOL  350 MG/ML SOLN COMPARISON:  None Available. FINDINGS: Cardiovascular: Satisfactory opacification of the pulmonary arteries to the segmental level. No evidence of pulmonary embolism. Normal heart size. No pericardial effusion. Mediastinum/Nodes: No enlarged mediastinal, hilar, or axillary lymph nodes. Thyroid gland, trachea, and esophagus demonstrate no significant findings. Lungs/Pleura: Diffuse bilateral bronchial wall thickening. No pleural effusion or pneumothorax. Upper Abdomen: No acute abnormality. Hepatic steatosis. Cholecystectomy. Musculoskeletal: No chest wall abnormality. No acute osseous findings. Review of the MIP images confirms the above findings. IMPRESSION: 1. Negative examination for pulmonary embolism. 2. Diffuse bilateral bronchial wall thickening, consistent with nonspecific infectious or inflammatory bronchitis. 3. Hepatic steatosis. Electronically Signed   By: Marolyn JONETTA Jaksch M.D.   On: 09/30/2024 15:08   CT Head Wo Contrast Result Date: 09/30/2024 CLINICAL DATA:  New onset headache, starting anticoagulation EXAM: CT HEAD WITHOUT CONTRAST TECHNIQUE: Contiguous axial images were obtained from the base of the skull through the vertex without intravenous contrast. RADIATION DOSE REDUCTION: This exam was performed according to the departmental dose-optimization program which includes automated exposure control, adjustment of the mA and/or kV according to patient size and/or use of iterative reconstruction technique. COMPARISON:  04/17/2024 FINDINGS: Brain: No evidence of acute infarction, hemorrhage, hydrocephalus, extra-axial collection or mass lesion/mass  effect. Mild periventricular white matter hypodensity. Vascular: No hyperdense vessel or unexpected calcification. Skull: Normal. Negative for fracture or focal lesion. Sinuses/Orbits: No acute finding. Other: None. IMPRESSION: No acute intracranial pathology. Electronically Signed   By: Marolyn JONETTA Jaksch M.D.   On: 09/30/2024 15:06   DG Chest 2 View Result Date: 09/30/2024 EXAM: 2 VIEW(S) XRAY OF THE CHEST 09/30/2024 12:28:29 PM COMPARISON: 04/17/2024 CLINICAL HISTORY: chest pain. Pt states that he started having some sinus issues last week. Was seen by the PCP on Friday. States that they gave him a Zpack and has 1 pill left. States that he is not having any better feeling and now it is all in the chest. Chest pain at times when ; coughing and taking deep breaths. FINDINGS: LUNGS AND PLEURA: No focal pulmonary opacity. No pulmonary edema. No pleural effusion. No pneumothorax. HEART AND MEDIASTINUM: No acute abnormality of the cardiac and mediastinal silhouettes. BONES AND SOFT TISSUES: Degenerative changes of thoracic spine. No acute osseous abnormality. IMPRESSION: 1. No acute cardiopulmonary process. Electronically signed by: Waddell Calk MD 09/30/2024 01:59 PM EDT RP Workstation: [id number]      Subjective:   Discharge  Exam: Vitals:   10/16/24 0947 10/16/24 1229  BP: (!) 122/58 123/63  Pulse: (!) 43 (!) 58  Resp: 18 16  Temp: (!) 97.5 F (36.4 C) 97.6 F (36.4 C)  SpO2: 100% 97%    General: Pt is alert, awake, not in acute distress Cardiovascular: rate controlled, S1/S2 + Respiratory: bilateral decreased breath sounds at bases Abdominal: Soft, NT, ND, bowel sounds + Extremities: no edema, no cyanosis    The results of significant diagnostics from this hospitalization (including imaging, microbiology, ancillary and laboratory) are listed below for reference.     Microbiology: Recent Results (from the past 240 hours)  Blood culture (routine x 2)     Status: None (Preliminary  result)   Collection Time: 10/13/24  6:37 PM   Specimen: BLOOD RIGHT ARM  Result Value Ref Range Status   Specimen Description   Final    BLOOD RIGHT ARM Performed at [facility], [address] [place], [place] [address]    Special Requests   Final    BOTTLES DRAWN AEROBIC AND ANAEROBIC Blood Culture adequate volume Performed at [facility], [address] [place], [place] [address]    Culture   Final    NO GROWTH 3 DAYS Performed at [facility], [address], [place], [place] [address]    Report Status PENDING  Incomplete  Blood culture (routine x 2)     Status: None (Preliminary result)   Collection Time: 10/13/24  8:02 PM   Specimen: BLOOD  Result Value Ref Range Status   Specimen Description   Final    BLOOD LEFT ANTECUBITAL Performed at [facility], [address] [place], [place] [address]    Special Requests   Final    BOTTLES DRAWN AEROBIC AND ANAEROBIC Blood Culture adequate volume Performed at [facility], [address] [place], [place] [address]    Culture   Final    NO GROWTH 3 DAYS Performed at [facility], [address], [place], [place] [address]    Report Status PENDING  Incomplete     Labs: BNP (last 3 results) No results for input(s): BNP in the last 8760 hours. Basic Metabolic Panel: Recent Labs  Lab 10/13/24 1811 10/14/24 0249 10/15/24 0244 10/16/24 0250  NA 139 139 137 140  K 4.0 3.6 3.5 3.5  CL 104 106 104 105  CO2 23 25 26 24   GLUCOSE 120* 118* 106* 109*  BUN 18 16 17 18   CREATININE 1.37* 1.45* 1.46* 1.58*  CALCIUM  9.1 8.9 8.6* 8.6*  MG  --  1.8 2.0  --    Liver Function Tests: Recent Labs  Lab 10/13/24 1811  AST 25  ALT 37  ALKPHOS 72  BILITOT 0.6  PROT 6.4*  ALBUMIN 4.0   Recent Labs  Lab 10/13/24 1811  LIPASE 19   No results for input(s): AMMONIA in the last 168 hours. CBC: Recent Labs  Lab 10/13/24 1811 10/14/24 0249 10/15/24 0244  10/16/24 0250  WBC 11.8* 10.7* 10.9* 10.1  HGB 13.2 12.5* 12.5* 12.7*  HCT 38.6* 36.7* 36.9* 38.3*  MCV 87.7 87.8 88.3 88.7  PLT 186 177 186 182   Cardiac Enzymes: No results for input(s): CKTOTAL, CKMB, CKMBINDEX, TROPONINI in the last 168 hours. BNP: Invalid input(s): POCBNP CBG: No results for input(s): GLUCAP in the last 168 hours. D-Dimer No results for input(s): DDIMER in the last 72 hours. Hgb A1c  No results for input(s): HGBA1C in the last 72 hours. Lipid Profile No results for input(s): CHOL, HDL, LDLCALC, TRIG, CHOLHDL, LDLDIRECT in the last 72 hours. Thyroid function studies No results for input(s): TSH, T4TOTAL, T3FREE, THYROIDAB in the last 72 hours.  Invalid input(s): FREET3 Anemia work up No results for input(s): VITAMINB12, FOLATE, FERRITIN, TIBC, IRON, RETICCTPCT in the last 72 hours. Urinalysis    Component Value Date/Time   COLORURINE YELLOW 10/13/2024 1811   APPEARANCEUR CLEAR 10/13/2024 1811   LABSPEC 1.020 10/13/2024 1811   PHURINE 5.5 10/13/2024 1811   GLUCOSEU NEGATIVE 10/13/2024 1811   HGBUR TRACE (A) 10/13/2024 1811   BILIRUBINUR NEGATIVE 10/13/2024 1811   BILIRUBINUR negative 02/08/2023 0000   KETONESUR NEGATIVE 10/13/2024 1811   PROTEINUR NEGATIVE 10/13/2024 1811   UROBILINOGEN 0.2 02/08/2023 0000   UROBILINOGEN 0.2 06/24/2012 1535   NITRITE NEGATIVE 10/13/2024 1811   LEUKOCYTESUR NEGATIVE 10/13/2024 1811   Sepsis Labs Recent Labs  Lab 10/13/24 1811 10/14/24 0249 10/15/24 0244 10/16/24 0250  WBC 11.8* 10.7* 10.9* 10.1   Microbiology Recent Results (from the past 240 hours)  Blood culture (routine x 2)     Status: None (Preliminary result)   Collection Time: 10/13/24  6:37 PM   Specimen: BLOOD RIGHT ARM  Result Value Ref Range Status   Specimen Description   Final    BLOOD RIGHT ARM Performed at [facility], [address] [place], [place] [address]    Special  Requests   Final    BOTTLES DRAWN AEROBIC AND ANAEROBIC Blood Culture adequate volume Performed at [facility], [address] [place], [place] [address]    Culture   Final    NO GROWTH 3 DAYS Performed at [facility], [address] [place], [place] [address]    Report Status PENDING  Incomplete  Blood culture (routine x 2)     Status: None (Preliminary result)   Collection Time: 10/13/24  8:02 PM   Specimen: BLOOD  Result Value Ref Range Status   Specimen Description   Final    BLOOD LEFT ANTECUBITAL Performed at [facility], [address] [place], [place] [address]    Special Requests   Final    BOTTLES DRAWN AEROBIC AND ANAEROBIC Blood Culture adequate volume Performed at [facility], [address] [place], [place] [address]    Culture   Final    NO GROWTH 3 DAYS Performed at [facility], [address], [place], [place] [address]    Report Status PENDING  Incomplete     Time coordinating discharge: 35 minutes  SIGNED:   Derryl Duval, MD  [facility] 10/16/2024, 3:17 PM

## 2024-10-16 NOTE — TOC CM/SW Note (Signed)
 Transition of Care [facility]) - Inpatient Brief Assessment   Patient Details  Name: Brandon Burns MRN: [id number] Date of Birth: 1946/06/22  Transition of Care [facility]) CM/SW Contact:    Waddell Barnie Rama, RN Phone Number: 10/16/2024, 2:43 PM   Clinical Narrative: From home with spouse, has PCP and insurance on file, states has no HH services in place at this time or DME at home.  States family member (wife)  will transport them home at [employer] and family is support system, states gets medications from [facility] in [place].  Pta self ambulatory.   There are no ICM needs identified  at this time.  Please place consult for ICM needs.     Transition of Care Asessment: Insurance and Status: Insurance coverage has been reviewed Patient has primary care physician: Yes Home environment has been reviewed: home with wife Prior level of function:: indep Prior/Current Home Services: No current home services Social Drivers of Health Review: SDOH reviewed no interventions necessary Readmission risk has been reviewed: Yes Transition of care needs: no transition of care needs at this time

## 2024-10-18 LAB — CULTURE, BLOOD (ROUTINE X 2)
Culture: NO GROWTH
Culture: NO GROWTH
Special Requests: ADEQUATE
Special Requests: ADEQUATE

## 2024-10-23 ENCOUNTER — Encounter (HOSPITAL_COMMUNITY): Payer: Self-pay

## 2024-10-23 ENCOUNTER — Encounter (INDEPENDENT_AMBULATORY_CARE_PROVIDER_SITE_OTHER): Payer: Self-pay | Admitting: Otolaryngology

## 2024-10-23 ENCOUNTER — Ambulatory Visit (INDEPENDENT_AMBULATORY_CARE_PROVIDER_SITE_OTHER): Admitting: Otolaryngology

## 2024-10-23 ENCOUNTER — Telehealth (HOSPITAL_COMMUNITY): Payer: Self-pay

## 2024-10-23 ENCOUNTER — Telehealth: Payer: Self-pay | Admitting: Internal Medicine

## 2024-10-23 VITALS — BP 145/88 | HR 70 | Ht 73.0 in | Wt 223.0 lb

## 2024-10-23 DIAGNOSIS — Z789 Other specified health status: Secondary | ICD-10-CM

## 2024-10-23 DIAGNOSIS — G4733 Obstructive sleep apnea (adult) (pediatric): Secondary | ICD-10-CM | POA: Diagnosis not present

## 2024-10-23 DIAGNOSIS — G4700 Insomnia, unspecified: Secondary | ICD-10-CM | POA: Diagnosis not present

## 2024-10-23 NOTE — Telephone Encounter (Signed)
 Attempted to reach patient to discuss upcoming procedure, no answer. Unable to leave VM.

## 2024-10-23 NOTE — Progress Notes (Signed)
 Dear Dr. Millicent, Here is my assessment for our mutual patient, Brandon Burns. Thank you for allowing me the opportunity to care for your patient. Please do not hesitate to contact me should you have any other questions. Sincerely, Dr. Eldora Blanch  Otolaryngology Clinic Note Referring provider: Dr. Millicent HPI:  Initial visit (10/2024): Brandon Burns is a 78 y.o. male kindly referred by Dr. Millicent for evaluation of OSA for [facility] candidacy evaluation.   Diagnosed with CPAP forever ago. Had a CPAP in 2013, with workup at the [facility]. He has tried multiple masks for this, including nasal mask and full face mask, but is claustrophobic so cannot use it. Last sleep study was over 10 years ago Using CPAP: no - last used about 10 years ago Insomnia: on Ambien  -- well treated Chest surgery: chest Denies dysphonia, dysphagia [place]  Recently diagnosed with A-fib, cardioverted, has pacemaker upcoming.  No sleep study available for review  Personal or FHx of bleeding dz or anesthesia difficulty: no  GLP-1: no AP/AC: Eliquis   Tobacco: no  H&N Surgery: Tonsillectomy, Tymp tube  PMHx: CAD, HLD, HTN, Aortic Atherosclerosis, CKD, PTSD, Insomnia, HF, AV Block  Independent Review of Additional Tests or Records:  Dr. Loni (07/02/2024): noted sleep apnea but does not use CPAP due to discomfort and claustrophobia; on Ambien  for sleep; Dx: OSA with CPAP intolerance, ref to ENT Cleatrice Gosling (05/23/2024): tried CPAP for 3 months, could not tolerate; Dx: OSA, Ref to ENT Notes reviewed Dr. Cathaleen 10/16/2024 -- A-fib with exertional dyspnea, in A-fib. Eventually cardioverted 10/15/2024. 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   Lumbar degenerative disc disease 04/18/2017   Mixed hyperlipidemia 09/25/2019   Stage 3 chronic kidney disease (HCC) 01/06/2018   Vitamin D deficiency 12/05/2020     Past Surgical History:  Procedure Laterality Date   BACK SURGERY     CARDIOVERSION N/A 10/15/2024   Procedure: CARDIOVERSION;  Surgeon: Floretta Mallard, MD;  Location: [facility] INVASIVE CV LAB;  Service: Cardiovascular;  Laterality: N/A;   CHOLECYSTECTOMY     HEMORRHOID SURGERY     LEFT HEART CATH AND CORONARY ANGIOGRAPHY N/A 10/19/2023   Procedure: LEFT HEART CATH AND CORONARY ANGIOGRAPHY;  Surgeon: Verlin Lonni BIRCH, MD;  Location: [facility] INVASIVE CV LAB;  Service: Cardiovascular;  Laterality: N/A;   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   TRANSESOPHAGEAL ECHOCARDIOGRAM (CATH LAB) N/A 10/15/2024   Procedure: TRANSESOPHAGEAL ECHOCARDIOGRAM;  Surgeon: Floretta Mallard, MD;  Location: [facility] INVASIVE CV LAB;  Service: Cardiovascular;  Laterality: N/A;    History reviewed. No pertinent family history.   Social Connections: Socially Integrated (10/14/2024)   Social Connection and Isolation Panel    Frequency of Communication with Friends and Family: More than three times a week    Frequency of Social Gatherings with Friends and Family: Twice a week    Attends Religious Services: More than 4 times per year    Active Member of [employer] or Organizations: Yes    Attends Banker Meetings: 1 to 4 times per year    Marital Status:  Married      Current Outpatient Medications:    acetaminophen  (TYLENOL ) 500 MG tablet, Take 1,000 mg by mouth every 6 (six) hours as needed for moderate pain (pain score 4-6)., Disp: , Rfl:    amLODipine (NORVASC) 2.5 MG tablet, Take 2.5 mg by mouth daily., Disp: , Rfl:    apixaban (ELIQUIS) 5 MG TABS  tablet, Take 1 tablet (5 mg total) by mouth 2 (two) times daily., Disp: 60 tablet, Rfl: 3   benzonatate  (TESSALON ) 200 MG capsule, Take 200 mg by mouth 2 (two) times daily as needed for cough., Disp: , Rfl:    Brimonidine Tartrate (LUMIFY) 0.025 % SOLN, Place 1 drop into both eyes daily., Disp: , Rfl:    Cholecalciferol (VITAMIN D3) 50 MCG (2000 UT) TABS, Take 2,000 Units by mouth daily., Disp: , Rfl:    fluticasone  (FLONASE ) 50 MCG/ACT nasal spray, Place 1 spray into both nostrils 2 (two) times daily., Disp: , Rfl:    omeprazole (PRILOSEC) 20 MG capsule, Take 20 mg by mouth daily., Disp: , Rfl:    tiZANidine  (ZANAFLEX ) 4 MG tablet, Take 4 mg by mouth at bedtime., Disp: , Rfl:    vitamin B-12 (CYANOCOBALAMIN) 500 MCG tablet, Take 500 mcg by mouth daily., Disp: , Rfl:    zolpidem  (AMBIEN ) 10 MG tablet, Take 10 mg by mouth at bedtime., Disp: , Rfl:    ezetimibe  (ZETIA ) 10 MG tablet, Take 1 tablet (10 mg total) by mouth daily. (Patient not taking: Reported on 10/23/2024), Disp: 90 tablet, Rfl: 3   fexofenadine (ALLEGRA) 180 MG tablet, Take 180 mg by mouth daily. (Patient not taking: Reported on 10/23/2024), Disp: , Rfl:    Physical Exam:   BP (!) 145/88 (BP Location: Left Arm, Patient Position: Sitting, Cuff Size: Large)   Pulse 70   Ht 6' 1 (1.854 m)   Wt 223 lb (101.2 kg)   SpO2 97%   BMI 29.42 kg/m   Salient findings:  CN II-XII intact Bilateral EAC clear and TM intact with well pneumatized middle ear spaces Anterior rhinoscopy: Septum modestly deviated; bilateral inferior turbinates with mild hypertrophy No lesions of oral cavity/oropharynx; Tongue Friedman 3 No obviously palpable neck masses/lymphadenopathy/thyromegaly No respiratory distress or stridor No chest scars on right  Seprately Identifiable Procedures:  Prior to initiating any procedures, risks/benefits/alternatives were explained to the patient and verbal consent obtained.  Electronically signed by: Eldora KATHEE Blanch, MD  10/23/2024 1:08 PM   Impression & Plans:  Brandon Burns is a 78 y.o. male with:  1. OSA (obstructive sleep apnea)   2. Intolerance of continuous positive airway pressure (CPAP) ventilation   3. Insomnia with sleep apnea    We discussed options including continued CPAP v/s Inspire. He cannot tolerate CPAP he reports.  We had a discussion regarding risks of Inspire including lack of benefit, persistent symptoms, pneumothorax, tongue soreness or weakness, Floor of mouth numbness, injury to major vessels, hematoma, implant infection, bleeding, scarring, tethering of neck, persistent symptoms, need for further procedures, and risk of anesthesia among others.   He needs further workup including updated sleep test and DISE to see if he qualifies. BMI 29.  As such, will refer to sleep/pulm for updated sleep test and workup.  Insomnia managed well with benzodiazepine.  Will follow up by phone in 6 months to check progress, otherwise if workup done sooner, can see him sooner.  Will wait at least 6 months after pacemaker to make sure he is stable from that standpoint before  proceeding  See below regarding exact medications prescribed this encounter including dosages and route: No orders of the defined types were placed in this encounter.     Thank you for allowing me the opportunity to care for your patient. Please do not hesitate to contact me should you have any other questions.  Sincerely, Eldora Blanch, MD Otolaryngologist (ENT), [facility] Phone: [phone] Fax: [phone]  10/23/2024, 1:08 PM   MDM:  I have personally spent 47 minutes involved in face-to-face and non-face-to-face activities for this patient on the day of the visit.  Professional time spent excludes any procedures performed but includes the following activities, in addition to those noted in the documentation: preparing to see the patient (review of outside documentation and results), performing a  medically appropriate examination, extensive counseling, documenting in the electronic health record

## 2024-10-23 NOTE — Telephone Encounter (Signed)
 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 The timing is the issue. I would recommend he continue his blood thinners 2-3 more weeks, then hold for 2 days and return for PPM. My office will coordinate. Systemic anti-coag - Because he is on chronic anti-coagulation for the past couple of days, and freshly cardioverted, I would recommend he continue the eliquis and we will have him return for PPM in a couple of weeks  I spoke with patient's wife.  She is asking if patient can have pacemaker placed sooner.  He is not having any symptoms although he will  notice his heart rate goes down a little at night. Patient apprehensive about having procedure and wanted to do sooner if possible I explained to patient's wife current time frame was needed due to time needed to be on blood thinner.  Wife asking if patient could get flu shot now and I told her this should be OK

## 2024-10-23 NOTE — Telephone Encounter (Signed)
 Patient's wife calling because the patient is scheduled to have a pacemaker implanted. Patient's wife is requesting to know if they are able to have this procedure completed at a sooner date. Please advise.

## 2024-10-27 NOTE — Telephone Encounter (Signed)
 Only way we would place pacemaker sooner if he were to start passing out. Absent this, proceed with PPM as planned in early December. GT

## 2024-10-28 NOTE — Addendum Note (Signed)
 Encounter addended by: Janel Nancy SAUNDERS, RN on: 10/28/2024 11:29 AM  Actions taken: Imaging Exam ended

## 2024-10-29 NOTE — Telephone Encounter (Signed)
 Left message to call back.

## 2024-10-30 ENCOUNTER — Telehealth: Payer: Self-pay | Admitting: Internal Medicine

## 2024-10-30 ENCOUNTER — Encounter (INDEPENDENT_AMBULATORY_CARE_PROVIDER_SITE_OTHER): Payer: Self-pay

## 2024-10-30 NOTE — Telephone Encounter (Signed)
 Spoke with patient spouse in regards to picking up solution for showering prior to procedure. Stated she went to [facility] earlier this week and waited 30 minutes and the solution was unavailable. Pt inquired if [facility] has it there. Spoke with Pharmacist and it is available for pt or spouse to pick up. Called spouse and will pick-up today or Monday. Opportunities given for questions and verbalized understanding.

## 2024-10-30 NOTE — Telephone Encounter (Signed)
 Pt would like a c/b to see if the wash solution was ready to be picked up. Pt stated the solution is needed before pacemaker is implanted please advise

## 2024-11-04 ENCOUNTER — Ambulatory Visit (HOSPITAL_COMMUNITY): Admitting: Physician Assistant

## 2024-11-05 NOTE — Pre-Procedure Instructions (Signed)
 Attempted to call patient regarding procedure instructions for tomorrow.  Left voicemail on the following items: Arrival time 1030 Nothing to eat or drink after midnight No meds AM of procedure Responsible person to drive you home and stay with you for 24 hrs Wash with special soap night before and morning of procedure If on anti-coagulant drug instructions Eliquis - last dose should have been 11/30

## 2024-11-06 ENCOUNTER — Ambulatory Visit (HOSPITAL_COMMUNITY)
Admission: RE | Disposition: A | Discharge status: Home / Self Care | Payer: Self-pay | Source: Home / Self Care | Attending: Internal Medicine

## 2024-11-06 ENCOUNTER — Ambulatory Visit (HOSPITAL_COMMUNITY)

## 2024-11-06 ENCOUNTER — Other Ambulatory Visit: Payer: Self-pay

## 2024-11-06 ENCOUNTER — Ambulatory Visit (HOSPITAL_COMMUNITY)
Admission: RE | Admit: 2024-11-06 | Discharge: 2024-11-06 | Disposition: A | Discharge status: Home / Self Care | Attending: Internal Medicine | Admitting: Internal Medicine

## 2024-11-06 HISTORY — PX: PACEMAKER IMPLANT: EP1218

## 2024-11-06 SURGERY — PACEMAKER IMPLANT

## 2024-11-06 MED ORDER — MIDAZOLAM HCL 2 MG/2ML IJ SOLN
INTRAMUSCULAR | Status: AC
  Filled 2024-11-06: qty 2

## 2024-11-06 MED ORDER — SODIUM CHLORIDE 0.9 % IV SOLN
80.0000 mg | INTRAVENOUS | Status: AC
  Administered 2024-11-06: 80 mg

## 2024-11-06 MED ORDER — LIDOCAINE HCL (PF) 1 % IJ SOLN
INTRAMUSCULAR | Status: DC | PRN
  Administered 2024-11-06: 30 mL

## 2024-11-06 MED ORDER — POVIDONE-IODINE 10 % EX SWAB
2.0000 | Freq: Once | CUTANEOUS | Status: AC
  Administered 2024-11-06: 2 via TOPICAL

## 2024-11-06 MED ORDER — CHLORHEXIDINE GLUCONATE 4 % EX SOLN: 4.0000 | Freq: Once | CUTANEOUS | Status: DC

## 2024-11-06 MED ORDER — LIDOCAINE HCL (PF) 1 % IJ SOLN
INTRAMUSCULAR | Status: AC
  Filled 2024-11-06: qty 60

## 2024-11-06 MED ORDER — MIDAZOLAM HCL 5 MG/5ML IJ SOLN
INTRAMUSCULAR | Status: DC | PRN
  Administered 2024-11-06 (×3): 1 mg via INTRAVENOUS

## 2024-11-06 MED ORDER — FENTANYL CITRATE (PF) 100 MCG/2ML IJ SOLN
INTRAMUSCULAR | Status: DC | PRN
  Administered 2024-11-06 (×3): 12.5 ug via INTRAVENOUS

## 2024-11-06 MED ORDER — IOHEXOL 350 MG/ML SOLN
INTRAVENOUS | Status: DC | PRN
  Administered 2024-11-06: 10 mL

## 2024-11-06 MED ORDER — SODIUM CHLORIDE 0.9 % IV SOLN: INTRAVENOUS | Status: DC

## 2024-11-06 MED ORDER — FENTANYL CITRATE (PF) 100 MCG/2ML IJ SOLN
INTRAMUSCULAR | Status: AC
  Filled 2024-11-06: qty 2

## 2024-11-06 MED ORDER — CEFAZOLIN SODIUM-DEXTROSE 2-3 GM-%(50ML) IV SOLR
INTRAVENOUS | Status: DC | PRN
  Administered 2024-11-06: 2 g via INTRAVENOUS

## 2024-11-06 MED ORDER — CEFAZOLIN SODIUM-DEXTROSE 1-4 GM/50ML-% IV SOLN
1.0000 g | Freq: Once | INTRAVENOUS | Status: AC
  Administered 2024-11-06: 1 g via INTRAVENOUS
  Filled 2024-11-06: qty 50

## 2024-11-06 MED ORDER — CEFAZOLIN SODIUM-DEXTROSE 2-4 GM/100ML-% IV SOLN
INTRAVENOUS | Status: DC
  Filled 2024-11-06: qty 100

## 2024-11-06 MED ORDER — ACETAMINOPHEN 325 MG PO TABS: 325.0000 mg | ORAL_TABLET | ORAL | Status: DC | PRN

## 2024-11-06 MED ORDER — SODIUM CHLORIDE 0.9 % IV SOLN
INTRAVENOUS | Status: DC
  Filled 2024-11-06: qty 2

## 2024-11-06 MED ORDER — CEFAZOLIN SODIUM-DEXTROSE 2-4 GM/100ML-% IV SOLN: 2.0000 g | INTRAVENOUS | Status: DC

## 2024-11-06 MED ORDER — ONDANSETRON HCL 4 MG/2ML IJ SOLN: 4.0000 mg | Freq: Four times a day (QID) | INTRAMUSCULAR | Status: DC | PRN

## 2024-11-06 SURGICAL SUPPLY — 11 items
CABLE SURGICAL S-101-97-12 (CABLE) ×1 IMPLANT
CATH RIGHTSITE C315HIS02 (CATHETERS) IMPLANT
IPG PACE AZUR XT DR MRI W1DR01 (Pacemaker) IMPLANT
KIT MICROPUNCTURE NIT STIFF (SHEATH) IMPLANT
LEAD CAPSURE NOVUS 5076-52CM (Lead) IMPLANT
LEAD SELECT SECURE 3830 383069 (Lead) IMPLANT
PAD DEFIB RADIO PHYSIO CONN (PAD) ×1 IMPLANT
SHEATH 7FR PRELUDE SNAP 13 (SHEATH) IMPLANT
SLITTER 6232ADJ (MISCELLANEOUS) IMPLANT
TRAY PACEMAKER INSERTION (PACKS) ×1 IMPLANT
WIRE HI TORQ VERSACORE-J 145CM (WIRE) IMPLANT

## 2024-11-06 NOTE — Discharge Instructions (Addendum)
 Care After Your Ablation and Pacemaker   You have a [employer]onic Pacemaker  ACTIVITY Do not lift your arm above shoulder height for 1 week after your procedure. After 7 days, you may progress as below.  You should remove your sling 24 hours after your procedure, unless otherwise instructed by your provider.     Wednesday November 13, 2024  Thursday November 14, 2024 Friday November 15, 2024 Saturday November 16, 2024   Do not lift, push, pull, or carry anything over 10 pounds with the affected arm until 6 weeks (Wednesday December 18, 2024 ) after your procedure.   You may drive AFTER your wound check, unless you have been told otherwise by your provider.   Ask your healthcare provider when you can go back to work   INCISION/Dressing If you are on a blood thinner such as Coumadin, Xarelto, Eliquis , Plavix, or Pradaxa please confirm with your provider when this should be resumed.   If large square, outer bandage is left in place on your chest, this can be removed after 24 hours from your procedure. Do not remove steri-strips or glue as below.   Monitor your Pacemaker site for redness, swelling, and drainage. Call the device clinic at [phone] if you experience these symptoms or fever/chills.  If your incision is sealed with Steri-strips or staples, you may shower 7 days after your procedure or when told by your provider. Do not remove the steri-strips or let the shower hit directly on your site. You may wash around your site with soap and water.    If you were discharged in a sling, please do not wear this during the day more than 48 hours after your surgery unless otherwise instructed. This may increase the risk of stiffness and soreness in your shoulder.   Avoid lotions, ointments, or perfumes over your incision until it is well-healed.  You may use a hot tub or a pool AFTER your wound check appointment if the incision is completely closed.  Pacemaker Alerts:  Some alerts are  vibratory and others beep. These are NOT emergencies. Please call our office to let us  know. If this occurs at night or on weekends, it can wait until the next business day. Send a remote transmission.  If your device is capable of reading fluid status (for heart failure), you will be offered monthly monitoring to review this with you.   DEVICE MANAGEMENT Remote monitoring is used to monitor your pacemaker from home. This monitoring is scheduled every 91 days by our office. It allows us  to keep an eye on the functioning of your device to ensure it is working properly. You will routinely see your Electrophysiologist annually (more often if necessary).   You should receive your ID card for your new device in 4-8 weeks. Keep this card with you at all times once received. Consider wearing a medical alert bracelet or necklace.  Your Pacemaker may be MRI compatible. This will be discussed at your next office visit/wound check.  You should avoid contact with strong electric or magnetic fields.   Do not use amateur (ham) radio equipment or electric (arc) welding torches. MP3 player headphones with magnets should not be used. Some devices are safe to use if held at least 12 inches (30 cm) from your Pacemaker. These include power tools, lawn mowers, and speakers. If you are unsure if something is safe to use, ask your health care provider.  When using your cell phone, hold it to the ear that is  on the opposite side from the Pacemaker. Do not leave your cell phone in a pocket over the Pacemaker.  You may safely use electric blankets, heating pads, computers, and microwave ovens.  What can I expect after the procedure? After the procedure, it is common to have: Bruising around your puncture site. Tenderness around your puncture site. Skipped heartbeats. Tiredness (fatigue).  Contact a health care provider if: You have redness, mild swelling, or pain around your puncture site. You have fluid or blood  coming from your puncture site that stops after applying firm pressure to the area. Your puncture site feels warm to the touch. You have pus or a bad smell coming from your puncture site. You have a fever. You have chest pain or discomfort that spreads to your neck, jaw, or arm. You are sweating a lot. You feel nauseous. You have a fast or irregular heartbeat. You have shortness of breath. You are dizzy or light-headed and feel the need to lie down. You have pain or numbness in the arm or leg closest to your puncture site.  Call the office right away if: You have chest pain. You feel more short of breath than you have felt before. You feel more light-headed than you have felt before. Your incision starts to open up. Your puncture site suddenly swells. Your puncture site is bleeding and the bleeding does not stop after applying firm pressure to the area. These symptoms may represent a serious problem that is an emergency. Do not wait to see if the symptoms will go away. Get medical help right away. Call your local emergency services (911 in the [place] Do not drive yourself to the hospital.  Puncture (groin) site care  Follow instructions from your health care provider about how to take care of your puncture site. Make sure you: If present, leave stitches (sutures), skin glue, or adhesive strips in place. These skin closures may need to stay in place for up to 2 weeks. If adhesive strip edges start to loosen and curl up, you may trim the loose edges. Do not remove adhesive strips completely unless your health care provider tells you to do that. If a large square bandage is present on your groin site, this may be removed 24 hours after surgery.  Check your puncture site every day for signs of infection. Check for: Redness, swelling, or pain. Fluid or blood. If your puncture site starts to bleed, lie down on your back, apply firm pressure to the area, and contact your health care  provider. Warmth. Pus or a bad smell. A pea or small marble sized lump at the site is normal and can take up to three months to resolve.  Driving As above, you may drive after your wound check, unless otherwise directed by your provider.  Do not drive or use heavy machinery while taking prescription pain medicine. Activity Avoid activities that take a lot of effort for at least 7 days after your procedure. Do not lift anything that is heavier than 5 lb (4.5 kg) for one week. After that, follow restrictions for your device as above.  No sexual activity for 1 week.  Return to your normal activities as told by your health care provider. Ask your health care provider what activities are safe for you. General instructions Take over-the-counter and prescription medicines only as told by your health care provider. Do not use any products that contain nicotine or tobacco, such as cigarettes and e-cigarettes. If you need help quitting, ask  your health care provider. Do not drink alcohol for 24 hours after your procedure. Keep all follow-up visits as told by your health care provider. This is important.  This information is not intended to replace advice given to you by your health care provider. Make sure you discuss any questions you have with your health care provider.

## 2024-11-06 NOTE — Progress Notes (Signed)
 Patient and patient wife given discharge instructions, education provided no further questions at this time. Patient able to void before discharge. Able to tolerate PO intake. Patient site is clean, dry, intact with no hematoma noted upon discharge. Patient seen by MD and Rep, EKG preformed, X-ray done and reviewed by MD Waddell per MD patient is okay to discharge.

## 2024-11-06 NOTE — Interval H&P Note (Signed)
 History and Physical Interval Note:  11/06/2024 11:03 AM  Brandon Burns  has presented today for surgery, with the diagnosis of heart block.  The various methods of treatment have been discussed with the patient and family. After consideration of risks, benefits and other options for treatment, the patient has consented to  Procedure(s): PACEMAKER IMPLANT (N/A) as a surgical intervention.  The patient's history has been reviewed, patient examined, no change in status, stable for surgery.  I have reviewed the patient's chart and labs.  Questions were answered to the patient's satisfaction.     Danelle Birmingham

## 2024-11-07 ENCOUNTER — Encounter (HOSPITAL_COMMUNITY): Payer: Self-pay | Admitting: Internal Medicine

## 2024-11-07 ENCOUNTER — Ambulatory Visit (HOSPITAL_COMMUNITY): Admitting: Physician Assistant

## 2024-11-07 MED FILL — Midazolam HCl Inj 2 MG/2ML (Base Equivalent): INTRAMUSCULAR | Qty: 3 | Status: AC

## 2024-11-20 ENCOUNTER — Ambulatory Visit

## 2024-11-20 DIAGNOSIS — I4819 Other persistent atrial fibrillation: Secondary | ICD-10-CM | POA: Diagnosis not present

## 2024-11-20 LAB — CUP PACEART INCLINIC DEVICE CHECK
Date Time Interrogation Session: 20251217161740
Implantable Lead Connection Status: 753985
Implantable Lead Connection Status: 753985
Implantable Lead Implant Date: 20251203
Implantable Lead Implant Date: 20251203
Implantable Lead Location: 753859
Implantable Lead Location: 753860
Implantable Lead Model: 3830
Implantable Lead Model: 5076
Implantable Pulse Generator Implant Date: 20251203

## 2024-11-20 NOTE — Patient Instructions (Signed)
   After Your Pacemaker   Monitor your pacemaker site for redness, swelling, and drainage. Call the device clinic at [phone] if you experience these symptoms or fever/chills.  Your incision was closed with Steri-strips or staples:  You may shower after today's visit and wash your incision with soap and water. Avoid lotions, ointments, or perfumes over your incision until it is well-healed.  You may use a hot tub or a pool after your wound check appointment if the incision is completely closed.  Do not lift, push or pull greater than 10 pounds with the affected arm until 6 weeks after your procedure. There are no other restrictions in arm movement after your wound check appointment.  You may drive, unless driving has been restricted by your healthcare providers.  Remote monitoring is used to monitor your pacemaker from home. This monitoring is scheduled every 91 days by our office. It allows us  to keep an eye on the functioning of your device to ensure it is working properly. You will routinely see your Electrophysiologist annually (more often if necessary).

## 2024-11-20 NOTE — Progress Notes (Signed)
 Normal dual chamber pacemaker wound check. Presenting rhythm: AS/VS. Wound well healed. Routine testing performed. Thresholds, sensing, and impedance consistent with implant measurements and at 3.5V safety margin/auto capture until 3 month visit. No episodes. Reviewed arm restrictions to continue for 6 weeks total post op.  Pt enrolled in remote follow-up.

## 2024-11-29 ENCOUNTER — Ambulatory Visit: Attending: Internal Medicine | Admitting: Internal Medicine

## 2024-11-29 ENCOUNTER — Encounter: Payer: Self-pay | Admitting: Internal Medicine

## 2024-11-29 VITALS — BP 123/80 | HR 82 | Ht 73.0 in | Wt 227.2 lb

## 2024-11-29 DIAGNOSIS — E782 Mixed hyperlipidemia: Secondary | ICD-10-CM

## 2024-11-29 DIAGNOSIS — I4819 Other persistent atrial fibrillation: Secondary | ICD-10-CM | POA: Diagnosis not present

## 2024-11-29 DIAGNOSIS — G4733 Obstructive sleep apnea (adult) (pediatric): Secondary | ICD-10-CM | POA: Diagnosis not present

## 2024-11-29 DIAGNOSIS — I480 Paroxysmal atrial fibrillation: Secondary | ICD-10-CM

## 2024-11-29 DIAGNOSIS — D6869 Other thrombophilia: Secondary | ICD-10-CM

## 2024-11-29 DIAGNOSIS — R072 Precordial pain: Secondary | ICD-10-CM

## 2024-11-29 DIAGNOSIS — I441 Atrioventricular block, second degree: Secondary | ICD-10-CM

## 2024-11-29 DIAGNOSIS — Z950 Presence of cardiac pacemaker: Secondary | ICD-10-CM

## 2024-11-29 DIAGNOSIS — Z79899 Other long term (current) drug therapy: Secondary | ICD-10-CM

## 2024-11-29 MED ORDER — EZETIMIBE 10 MG PO TABS: 10.0000 mg | ORAL_TABLET | Freq: Every day | ORAL | 3 refills | Status: AC

## 2024-11-29 NOTE — Progress Notes (Signed)
 " Cardiology Office Note:  .   Date:  11/29/2024  ID:  Brandon, Burns January 05, 1946, MRN [id number] PCP: Millicent Sharper, MD  [facility] Providers Cardiologist:  Soyla DELENA Merck, MD    History of Present Illness: Brandon   MAAZ SPIERING is a 78 y.o. male.  Discussed the use of AI scribe software for clinical note transcription with the patient, who gave verbal consent to proceed.  History of Present Illness Brandon Burns is a 78 year old male with atrial fibrillation and a pacemaker who presents for follow-up regarding his cardiac health.  He feels overall okay but has mild fatigue, which he attributes to sleeping 5.5 to 7 hours per night due to going to bed late and waking early.  He has a recently implanted pacemaker with remote transmissions for device checks. His last transmission on December 17 monitored pacemaker function. He keeps the transmitter by his bed at night.  He has exertional chest discomfort under the chest radiating toward the under right arm when walking uphill or on inclines. Symptoms resolve quickly with rest, occur intermittently, and are absent with activities such as golfing. He does not have nitroglycerin.  He has atrial fibrillation first detected during a bronchitis episode requiring hospitalization. Prior echocardiogram showed normal heart function. He takes Eliquis  twice daily.  He is not taking his prescribed Zetia  due to prior side effects from statin cholesterol medications.   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  Studies Reviewed: .        Results Radiology Cardiac CT: Anatomy grossly suitable for Watchman device placement if patient desires, measurement not made (Independently interpreted)  Diagnostic Pacemaker remote transmission (11/20/2024): Normal function; threshold, sensing, and impedance consistent with implant measurements; no interval changes since implantation Cardiac PET scan (stress): No evidence of large vessel coronary artery disease; possible mild microvascular dysfunction (Independently interpreted) Transthoracic echocardiogram (hospital): Normal left ventricular systolic function; normal myocardial appearance Transesophageal echocardiogram with cardioversion: Normal left ventricular systolic function; normal myocardial appearance; successful restoration of sinus rhythm Risk Assessment/Calculations:    CHA2DS2-VASc Score = 4   This indicates a 4.8% annual risk of stroke. The patient's score is based upon: CHF History: 0 HTN History: 1 Diabetes History: 0 Stroke History: 0 Vascular Disease History: 1 Age Score: 2 Gender Score: 0      Physical Exam:   VS:  BP 123/80 (BP Location: Left Arm, Patient Position: Sitting)   Pulse 82   Ht 6' 1 (1.854 m)   Wt 227 lb 3.2 oz (103.1 kg)   SpO2 98%   BMI 29.98 kg/m    Wt Readings from Last 3 Encounters:  11/29/24 227 lb 3.2 oz (103.1 kg)  11/06/24 223 lb 1.7 oz (101.2 kg)  10/23/24 223 lb (101.2 kg)     Physical Exam VITALS: P- 82 GENERAL: Alert, cooperative, well developed, no acute distress HEENT: Normocephalic, normal oropharynx, moist mucous membranes CHEST: Clear to auscultation bilaterally,  no wheezes, rhonchi, or crackles CARDIOVASCULAR: Normal heart rate and rhythm, S1 and S2 normal without murmurs, pacemaker intact ABDOMEN: Soft, non-tender, non-distended, without organomegaly, normal bowel  sounds EXTREMITIES: No cyanosis or edema NEUROLOGICAL: Cranial nerves grossly intact, moves all extremities without gross motor or sensory deficit   ASSESSMENT AND PLAN: .    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 Pacemaker functioning well with no complications. - Continue remote monitoring of pacemaker function  - Advised to avoid lifting the arm on the side of the pacemaker implant until cleared by EP - Instructed to seek immediate medical attention if the incision site opens or shows signs of infection.  Paroxysmal atrial fibrillation on anticoagulation Paroxysmal atrial fibrillation managed with Eliquis . No episodes reported during this visit. Discussed AFib recurrence and management options, including Watchman device. - Continue Eliquis  twice daily. - Monitor for symptoms of AFib, such as palpitations or irregular heartbeat. - Will consider Watchman device in six months if anticoagulation becomes problematic.  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 No significant coronary artery blockages. Discussed nitroglycerin for symptom management. - Monitor symptoms and report if discomfort worsens or becomes more frequent. - Will consider prescribing nitroglycerin if symptoms become more severe or frequent.  Hyperlipidemia Managed with ezetimibe . He has not been taking medication. Discussed importance of cholesterol management. - Restart ezetimibe  (Zetia ) as prescribed, 10 mg daily - Monitor for any side effects or intolerance.  Obstructive sleep apnea Candidate for Inspire device. No current restrictions from pacemaker implantation, pending clearance. - per EP on timing of inspire with recent pacemaker implant.  Recording duration: 21 minutes      Soyla Merck, MD, [facility] "

## 2024-11-29 NOTE — Patient Instructions (Addendum)
 Medication Instructions:  NO CHANGES *If you need a refill on your cardiac medications before your next appointment, please call your pharmacy*  Lab Work:  Follow-Up: At [facility], you and your health needs are our priority.  As part of our continuing mission to provide you with exceptional heart care, our providers are all part of one team.  This team includes your primary Cardiologist (physician) and Advanced Practice Providers or APPs (Physician Assistants and Nurse Practitioners) who all work together to provide you with the care you need, when you need it.  Your next appointment:   1 year(s) (We will mail a reminder letter around October; please call for a December 2026 appointment)  Provider:   Gayatri A Acharya, MD   Other Instructions Please call us  or send a MyChart message with any Cardiology related questions/concerns.  [phone].  Thank you!

## 2024-12-04 ENCOUNTER — Encounter: Payer: Self-pay | Admitting: Internal Medicine

## 2024-12-17 ENCOUNTER — Ambulatory Visit: Admitting: Pulmonary Disease

## 2024-12-17 ENCOUNTER — Encounter: Payer: Self-pay | Admitting: Pulmonary Disease

## 2024-12-18 ENCOUNTER — Ambulatory Visit

## 2024-12-18 DIAGNOSIS — I441 Atrioventricular block, second degree: Secondary | ICD-10-CM

## 2024-12-19 LAB — CUP PACEART REMOTE DEVICE CHECK
Battery Remaining Longevity: 169 mo
Battery Voltage: 3.22 V
Brady Statistic AP VP Percent: 0.04 %
Brady Statistic AP VS Percent: 49.29 %
Brady Statistic AS VP Percent: 0.02 %
Brady Statistic AS VS Percent: 50.65 %
Brady Statistic RA Percent Paced: 49.46 %
Brady Statistic RV Percent Paced: 0.06 %
Date Time Interrogation Session: 20260113220733
Implantable Lead Connection Status: 753985
Implantable Lead Connection Status: 753985
Implantable Lead Implant Date: 20251203
Implantable Lead Implant Date: 20251203
Implantable Lead Location: 753859
Implantable Lead Location: 753860
Implantable Lead Model: 3830
Implantable Lead Model: 5076
Implantable Pulse Generator Implant Date: 20251203
Lead Channel Impedance Value: 342 Ohm
Lead Channel Impedance Value: 380 Ohm
Lead Channel Impedance Value: 456 Ohm
Lead Channel Impedance Value: 532 Ohm
Lead Channel Pacing Threshold Amplitude: 0.5 V
Lead Channel Pacing Threshold Amplitude: 0.875 V
Lead Channel Pacing Threshold Pulse Width: 0.4 ms
Lead Channel Pacing Threshold Pulse Width: 0.4 ms
Lead Channel Sensing Intrinsic Amplitude: 21.625 mV
Lead Channel Sensing Intrinsic Amplitude: 21.625 mV
Lead Channel Sensing Intrinsic Amplitude: 5.5 mV
Lead Channel Sensing Intrinsic Amplitude: 5.5 mV
Lead Channel Setting Pacing Amplitude: 2 V
Lead Channel Setting Pacing Amplitude: 2 V
Lead Channel Setting Pacing Pulse Width: 0.4 ms
Lead Channel Setting Sensing Sensitivity: 1.2 mV
Zone Setting Status: 755011

## 2024-12-20 ENCOUNTER — Ambulatory Visit: Payer: Self-pay | Admitting: Cardiology

## 2024-12-23 ENCOUNTER — Telehealth: Payer: Self-pay | Admitting: *Deleted

## 2024-12-23 NOTE — Progress Notes (Signed)
 Remote PPM Transmission

## 2024-12-23 NOTE — Telephone Encounter (Signed)
 Alert received from [employer]:  Alert remote transmission: AT/AF >= 0.5 hr for 1 days. Presents in AF on the 1/17 remote.  To triage per protocol.  2 AF events since 1/13, longest is the current event that started on 1/17.   On OAC per EMR med list, but noted as expired.  Follow up as scheduled. [place], [facility] ________________________________________________________________________________  Called patient to assess for symptoms   Patient denied experiencing any symptoms during time episode started or currently   This RN requested a manual transmission to recheck patient's presenting rhythm  Transmission received and presenting EGM c/w SR (AS/VS)  AF episode noted to have lasted 34 hrs, 1 min, and 5 seconds with average bpm for A/V 405/67  AF burden 89.1%  Generally well controlled VR's  Patient confirmed continued compliance with Eliquis  medication and has only missed one dose in recent months  Will route to [employer] and Afib team who are following this patient for awareness and any possible recommendations

## 2025-01-15 ENCOUNTER — Ambulatory Visit

## 2025-02-05 ENCOUNTER — Ambulatory Visit: Admitting: Pulmonary Disease

## 2025-03-19 ENCOUNTER — Ambulatory Visit

## 2025-04-01 ENCOUNTER — Ambulatory Visit (INDEPENDENT_AMBULATORY_CARE_PROVIDER_SITE_OTHER): Admitting: Otolaryngology

## 2025-06-18 ENCOUNTER — Ambulatory Visit

## 2025-09-17 ENCOUNTER — Ambulatory Visit
